# Patient Record
Sex: Female | Born: 1935
Health system: Southern US, Community
[De-identification: ages and names within clinical notes are randomized; demographics above are authoritative.]

---

## 2004-07-18 ENCOUNTER — Inpatient Hospital Stay: Payer: Self-pay

## 2004-09-11 ENCOUNTER — Ambulatory Visit: Payer: Self-pay | Admitting: Internal Medicine

## 2005-11-04 ENCOUNTER — Ambulatory Visit: Payer: Self-pay | Admitting: Specialist

## 2005-11-04 ENCOUNTER — Other Ambulatory Visit: Payer: Self-pay

## 2005-11-08 ENCOUNTER — Emergency Department: Payer: Self-pay | Admitting: Emergency Medicine

## 2005-11-08 ENCOUNTER — Other Ambulatory Visit: Payer: Self-pay

## 2005-11-09 ENCOUNTER — Other Ambulatory Visit: Payer: Self-pay

## 2005-11-09 ENCOUNTER — Inpatient Hospital Stay: Payer: Self-pay | Admitting: Specialist

## 2006-07-07 ENCOUNTER — Inpatient Hospital Stay: Payer: Self-pay | Admitting: Specialist

## 2006-07-07 ENCOUNTER — Other Ambulatory Visit: Payer: Self-pay

## 2006-07-25 ENCOUNTER — Ambulatory Visit: Payer: Self-pay | Admitting: Internal Medicine

## 2006-07-28 ENCOUNTER — Ambulatory Visit: Payer: Self-pay | Admitting: Internal Medicine

## 2006-08-01 ENCOUNTER — Ambulatory Visit: Payer: Self-pay | Admitting: Internal Medicine

## 2006-08-07 ENCOUNTER — Ambulatory Visit: Payer: Self-pay | Admitting: Internal Medicine

## 2006-08-26 ENCOUNTER — Ambulatory Visit: Payer: Self-pay | Admitting: Internal Medicine

## 2006-10-03 ENCOUNTER — Ambulatory Visit: Payer: Self-pay | Admitting: Internal Medicine

## 2006-10-06 ENCOUNTER — Ambulatory Visit: Payer: Self-pay | Admitting: Gastroenterology

## 2006-10-25 ENCOUNTER — Ambulatory Visit: Payer: Self-pay | Admitting: Internal Medicine

## 2007-07-16 ENCOUNTER — Emergency Department: Payer: Self-pay | Admitting: Emergency Medicine

## 2007-12-03 ENCOUNTER — Ambulatory Visit: Payer: Self-pay | Admitting: Specialist

## 2007-12-03 ENCOUNTER — Other Ambulatory Visit: Payer: Self-pay

## 2007-12-21 ENCOUNTER — Ambulatory Visit: Payer: Self-pay | Admitting: Internal Medicine

## 2008-04-25 IMAGING — CR DG CHEST 2V
1 series · 2 of 2 positions shown · non-contrast
Comparison: none

REASON FOR EXAM: Shortness of Breath
COMMENTS:

PROCEDURE:     DXR - DXR CHEST PA (OR AP) AND LATERAL  - July 07, 2006  [DATE]
RESULT:     PA and lateral view reveals cardiomegaly.  Cardiac pacemaker
lead wires are present. The lung fields are clear. No effusions are noted.
Degenerative spurs are noted in the thoracic spine.

[Series 1: view not recorded · 0.17mm/px · 2 of 2 slices shown]
[im 1/2]
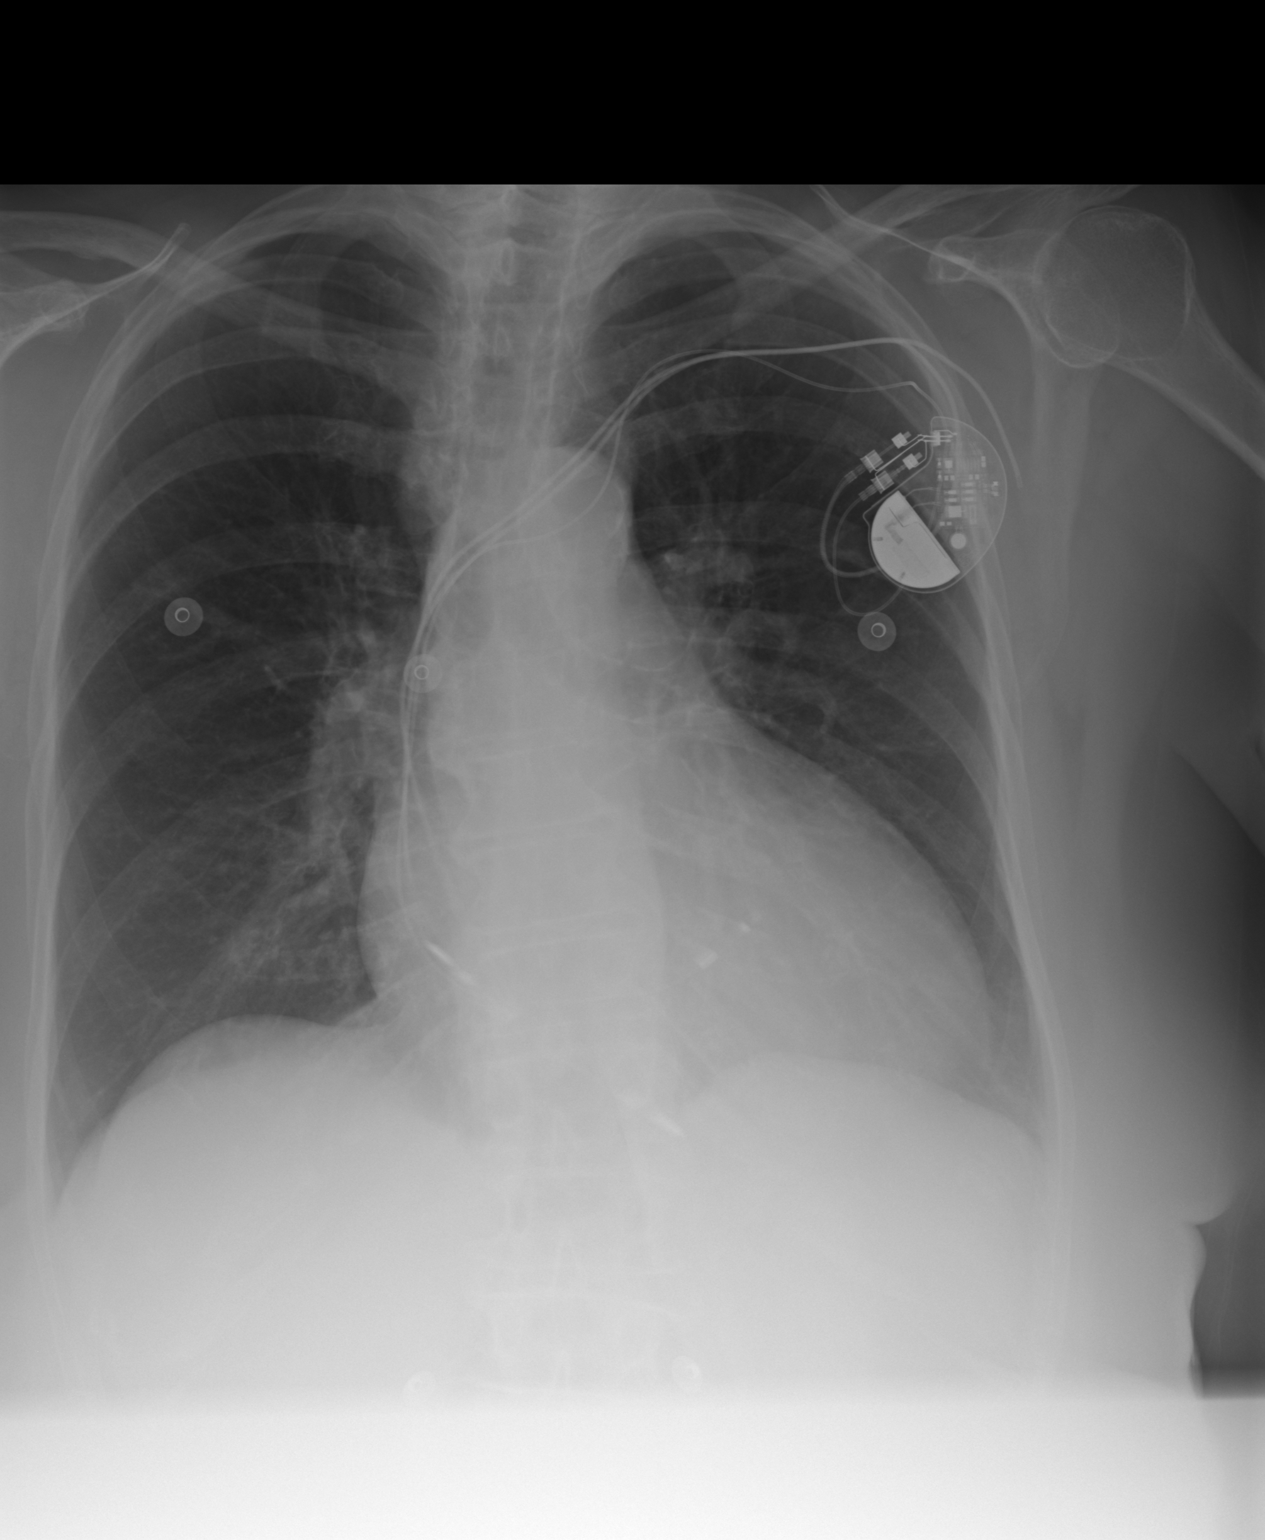
[im 2/2]
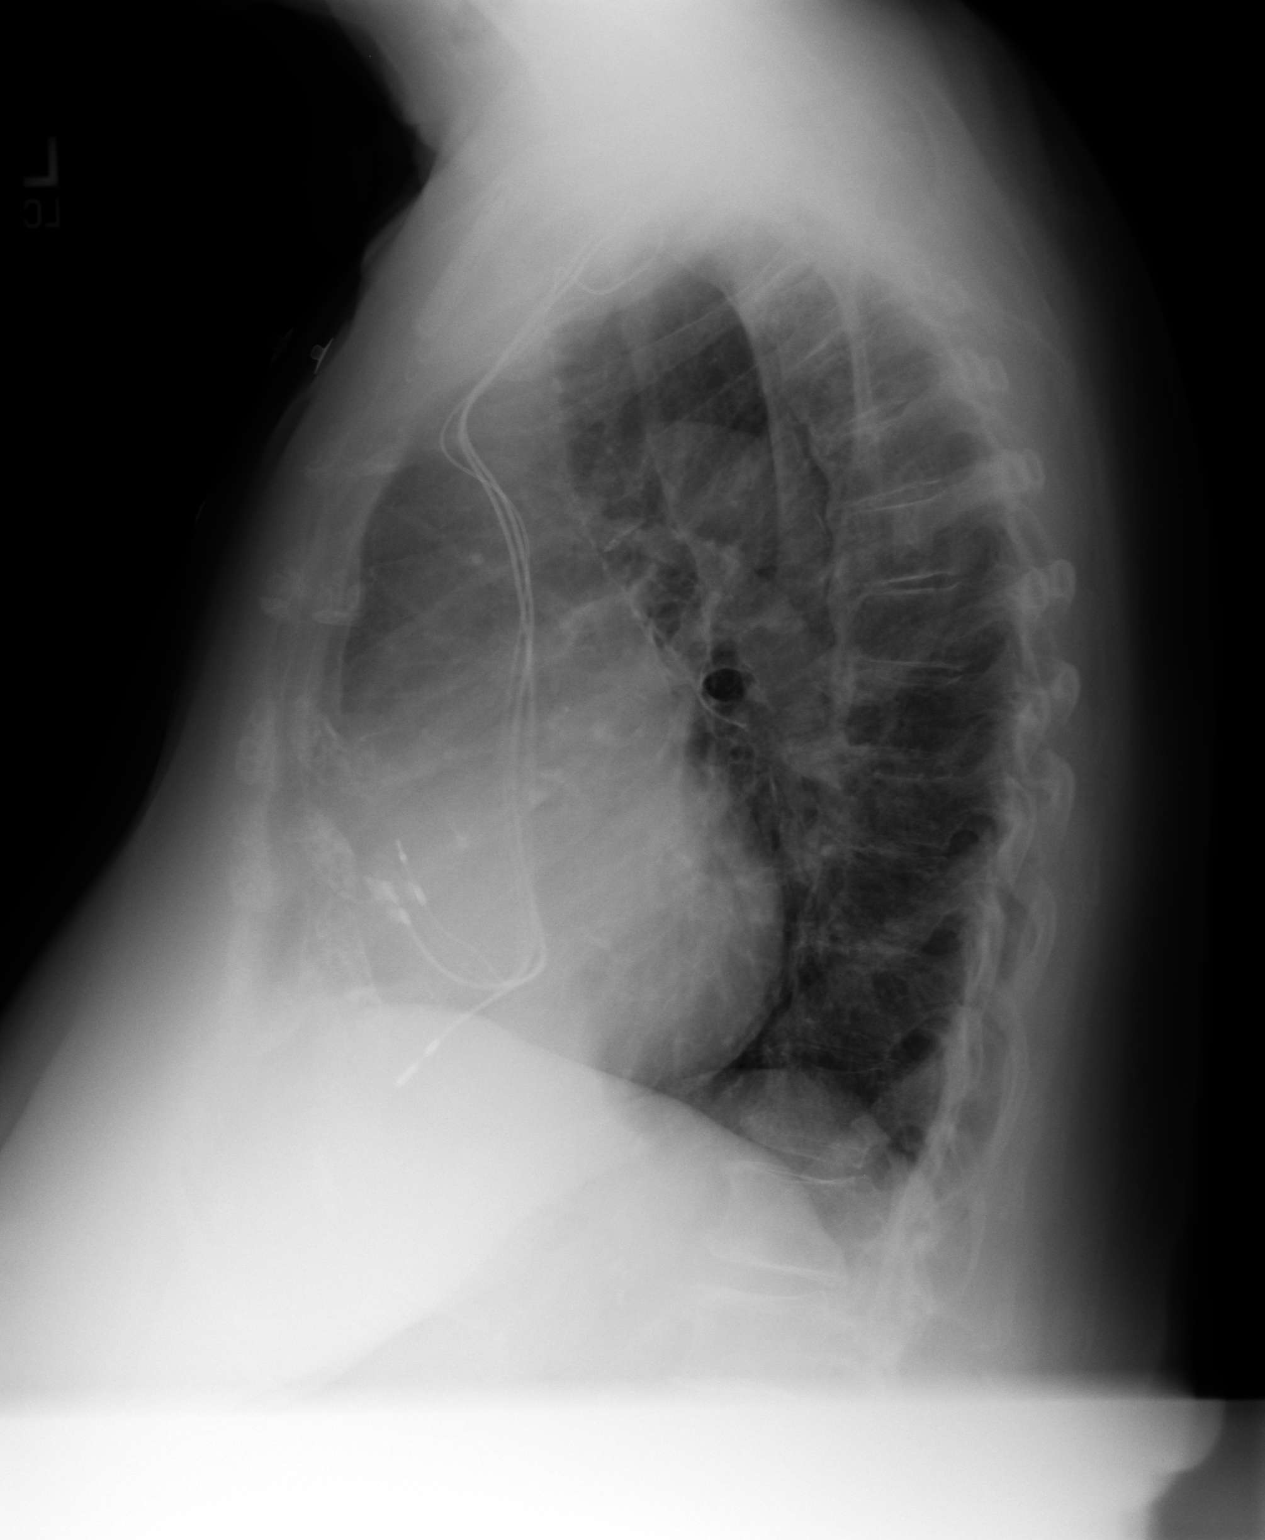

[2 of 2 positions shown; findings below may reference images not displayed]

IMPRESSION: 1)Cardiomegaly.

2)Lung fields are clear.

3)No congestive failure.

## 2009-07-25 ENCOUNTER — Ambulatory Visit: Payer: Self-pay | Admitting: Vascular Surgery

## 2009-08-30 ENCOUNTER — Ambulatory Visit: Payer: Self-pay | Admitting: Specialist

## 2009-09-12 ENCOUNTER — Ambulatory Visit: Payer: Self-pay | Admitting: Vascular Surgery

## 2009-12-19 ENCOUNTER — Inpatient Hospital Stay: Payer: Self-pay | Admitting: Internal Medicine

## 2010-10-22 ENCOUNTER — Ambulatory Visit: Payer: Self-pay | Admitting: Ophthalmology

## 2010-11-05 ENCOUNTER — Ambulatory Visit: Payer: Self-pay | Admitting: Ophthalmology

## 2010-12-19 ENCOUNTER — Ambulatory Visit: Payer: Self-pay | Admitting: Ophthalmology

## 2010-12-24 ENCOUNTER — Ambulatory Visit: Payer: Self-pay | Admitting: Ophthalmology

## 2011-01-03 ENCOUNTER — Ambulatory Visit: Payer: Self-pay | Admitting: Ophthalmology

## 2011-07-17 ENCOUNTER — Ambulatory Visit: Payer: Self-pay | Admitting: Specialist

## 2011-08-01 ENCOUNTER — Ambulatory Visit: Payer: Self-pay | Admitting: Cardiology

## 2011-08-06 ENCOUNTER — Ambulatory Visit: Payer: Self-pay | Admitting: Cardiology

## 2011-09-19 ENCOUNTER — Ambulatory Visit: Payer: Self-pay | Admitting: Gynecologic Oncology

## 2011-09-20 ENCOUNTER — Ambulatory Visit: Payer: Self-pay | Admitting: Urology

## 2011-09-24 ENCOUNTER — Ambulatory Visit: Payer: Self-pay | Admitting: Gynecologic Oncology

## 2011-09-27 ENCOUNTER — Ambulatory Visit: Payer: Self-pay | Admitting: Gynecologic Oncology

## 2011-10-25 ENCOUNTER — Ambulatory Visit: Payer: Self-pay | Admitting: Gynecologic Oncology

## 2011-11-25 ENCOUNTER — Ambulatory Visit: Payer: Self-pay | Admitting: Gynecologic Oncology

## 2011-12-25 ENCOUNTER — Ambulatory Visit: Payer: Self-pay | Admitting: Gynecologic Oncology

## 2012-02-04 ENCOUNTER — Ambulatory Visit: Payer: Self-pay | Admitting: Gynecologic Oncology

## 2012-02-24 ENCOUNTER — Ambulatory Visit: Payer: Self-pay | Admitting: Gynecologic Oncology

## 2012-03-13 ENCOUNTER — Encounter: Payer: Self-pay | Admitting: Cardiothoracic Surgery

## 2012-03-13 ENCOUNTER — Encounter: Payer: Self-pay | Admitting: Nurse Practitioner

## 2012-03-15 ENCOUNTER — Emergency Department: Payer: Self-pay | Admitting: Emergency Medicine

## 2012-03-15 LAB — URINALYSIS, COMPLETE
Bilirubin,UR: NEGATIVE
Blood: NEGATIVE
Glucose,UR: NEGATIVE mg/dL (ref 0–75)
Nitrite: NEGATIVE
Ph: 5 (ref 4.5–8.0)
Specific Gravity: 1.013 (ref 1.003–1.030)
Squamous Epithelial: 1

## 2012-03-15 LAB — COMPREHENSIVE METABOLIC PANEL
Albumin: 3.7 g/dL (ref 3.4–5.0)
Alkaline Phosphatase: 49 U/L — ABNORMAL LOW (ref 50–136)
BUN: 28 mg/dL — ABNORMAL HIGH (ref 7–18)
Bilirubin,Total: 0.4 mg/dL (ref 0.2–1.0)
Calcium, Total: 9 mg/dL (ref 8.5–10.1)
Chloride: 106 mmol/L (ref 98–107)
Creatinine: 0.89 mg/dL (ref 0.60–1.30)
EGFR (African American): >60
EGFR (Non-African Amer.): 58 — ABNORMAL LOW
Glucose: 122 mg/dL — ABNORMAL HIGH (ref 65–99)
Osmolality: 286 (ref 275–301)
Potassium: 4.3 mmol/L (ref 3.5–5.1)
Sodium: 140 mmol/L (ref 136–145)

## 2012-03-15 LAB — CBC
HGB: 11.9 g/dL — ABNORMAL LOW (ref 12.0–16.0)
MCH: 32 pg (ref 26.0–34.0)
Platelet: 243 10*3/uL (ref 150–440)
RBC: 3.73 10*6/uL — ABNORMAL LOW (ref 3.80–5.20)
WBC: 5.9 10*3/uL (ref 3.6–11.0)

## 2012-03-15 LAB — TROPONIN I: Troponin-I: <0.02 ng/mL

## 2012-03-15 LAB — PROTIME-INR: INR: 2.5

## 2012-03-26 ENCOUNTER — Encounter: Payer: Self-pay | Admitting: Nurse Practitioner

## 2012-03-26 ENCOUNTER — Encounter: Payer: Self-pay | Admitting: Cardiothoracic Surgery

## 2012-04-07 ENCOUNTER — Ambulatory Visit: Payer: Self-pay | Admitting: Vascular Surgery

## 2012-04-07 LAB — BASIC METABOLIC PANEL
Anion Gap: 9 (ref 7–16)
Calcium, Total: 9.2 mg/dL (ref 8.5–10.1)
Co2: 24 mmol/L (ref 21–32)
Potassium: 4.7 mmol/L (ref 3.5–5.1)
Sodium: 141 mmol/L (ref 136–145)

## 2012-04-07 LAB — PROTIME-INR
INR: 1.1
Prothrombin Time: 15 secs — ABNORMAL HIGH (ref 11.5–14.7)

## 2012-05-12 ENCOUNTER — Inpatient Hospital Stay: Payer: Self-pay | Admitting: Vascular Surgery

## 2012-05-12 LAB — HEMATOCRIT: HCT: 18.1 % — ABNORMAL LOW (ref 35.0–47.0)

## 2012-05-12 LAB — PROTIME-INR: INR: 1.5

## 2012-05-12 LAB — HEMOGLOBIN: HGB: 11.9 g/dL — ABNORMAL LOW (ref 12.0–16.0)

## 2012-05-12 LAB — BUN: BUN: 55 mg/dL — ABNORMAL HIGH (ref 7–18)

## 2012-05-19 ENCOUNTER — Inpatient Hospital Stay: Payer: Self-pay | Admitting: Internal Medicine

## 2012-05-19 LAB — CBC
HCT: 23.6 % — ABNORMAL LOW (ref 35.0–47.0)
MCHC: 34 g/dL (ref 32.0–36.0)
MCV: 97 fL (ref 80–100)
WBC: 13.1 10*3/uL — ABNORMAL HIGH (ref 3.6–11.0)

## 2012-05-19 LAB — COMPREHENSIVE METABOLIC PANEL
Albumin: 2.5 g/dL — ABNORMAL LOW (ref 3.4–5.0)
Alkaline Phosphatase: 66 U/L (ref 50–136)
Anion Gap: 8 (ref 7–16)
Bilirubin,Total: 0.5 mg/dL (ref 0.2–1.0)
Calcium, Total: 8.7 mg/dL (ref 8.5–10.1)
Co2: 25 mmol/L (ref 21–32)
EGFR (Non-African Amer.): >60
Osmolality: 285 (ref 275–301)
Potassium: 4.5 mmol/L (ref 3.5–5.1)
SGOT(AST): 29 U/L (ref 15–37)
Sodium: 137 mmol/L (ref 136–145)

## 2012-05-19 LAB — URINALYSIS, COMPLETE
Bilirubin,UR: NEGATIVE
Ketone: NEGATIVE
Nitrite: NEGATIVE
Ph: 8 (ref 4.5–8.0)
Protein: 30
RBC,UR: NONE SEEN /HPF (ref 0–5)
WBC UR: 3 /HPF (ref 0–5)

## 2012-05-19 LAB — PROTIME-INR: INR: 1.3

## 2012-05-19 LAB — APTT: Activated PTT: 34.3 secs (ref 23.6–35.9)

## 2012-05-19 LAB — LIPASE, BLOOD: Lipase: 442 U/L — ABNORMAL HIGH (ref 73–393)

## 2012-05-20 LAB — HEMOGLOBIN
HGB: 7.6 g/dL — ABNORMAL LOW (ref 12.0–16.0)
HGB: 8.1 g/dL — ABNORMAL LOW (ref 12.0–16.0)

## 2012-05-20 LAB — OCCULT BLOOD X 1 CARD TO LAB, STOOL: Occult Blood, Feces: POSITIVE

## 2012-05-23 LAB — CBC WITH DIFFERENTIAL/PLATELET
Basophil %: 0.2 %
Eosinophil %: 2.9 %
HCT: 21.7 % — ABNORMAL LOW (ref 35.0–47.0)
HGB: 7.2 g/dL — ABNORMAL LOW (ref 12.0–16.0)
Lymphocyte #: 0.6 10*3/uL — ABNORMAL LOW (ref 1.0–3.6)
MCH: 31.7 pg (ref 26.0–34.0)
Monocyte #: 0.7 x10 3/mm (ref 0.2–0.9)
Monocyte %: 8.3 %
Neutrophil #: 6.8 10*3/uL — ABNORMAL HIGH (ref 1.4–6.5)
Neutrophil %: 81.4 %
Platelet: 248 10*3/uL (ref 150–440)
RBC: 2.26 10*6/uL — ABNORMAL LOW (ref 3.80–5.20)
RDW: 14.7 % — ABNORMAL HIGH (ref 11.5–14.5)
WBC: 8.3 10*3/uL (ref 3.6–11.0)

## 2012-05-23 LAB — URINE CULTURE

## 2012-05-23 LAB — HEMOGLOBIN: HGB: 7.2 g/dL — ABNORMAL LOW (ref 12.0–16.0)

## 2012-05-24 LAB — CBC WITH DIFFERENTIAL/PLATELET
Basophil #: 0.1 10*3/uL (ref 0.0–0.1)
Basophil %: 0.7 %
Eosinophil #: 0.3 10*3/uL (ref 0.0–0.7)
HGB: 7.2 g/dL — ABNORMAL LOW (ref 12.0–16.0)
Lymphocyte #: 0.8 10*3/uL — ABNORMAL LOW (ref 1.0–3.6)
Lymphocyte %: 9.5 %
MCH: 31.8 pg (ref 26.0–34.0)
MCHC: 33.1 g/dL (ref 32.0–36.0)
MCV: 96 fL (ref 80–100)
Monocyte #: 0.7 x10 3/mm (ref 0.2–0.9)
Neutrophil #: 6.3 10*3/uL (ref 1.4–6.5)
Neutrophil %: 77.5 %
RDW: 14.6 % — ABNORMAL HIGH (ref 11.5–14.5)

## 2012-05-26 LAB — CBC WITH DIFFERENTIAL/PLATELET
Basophil #: 0 10*3/uL (ref 0.0–0.1)
Eosinophil #: 0.1 10*3/uL (ref 0.0–0.7)
HCT: 25.8 % — ABNORMAL LOW (ref 35.0–47.0)
HGB: 8.8 g/dL — ABNORMAL LOW (ref 12.0–16.0)
Lymphocyte %: 4.8 %
MCH: 31.4 pg (ref 26.0–34.0)
MCHC: 33.9 g/dL (ref 32.0–36.0)
MCV: 93 fL (ref 80–100)
Monocyte #: 0.9 x10 3/mm (ref 0.2–0.9)
Monocyte %: 8.8 %
Neutrophil #: 9 10*3/uL — ABNORMAL HIGH (ref 1.4–6.5)
Neutrophil %: 85.5 %
RDW: 15.7 % — ABNORMAL HIGH (ref 11.5–14.5)

## 2012-05-26 LAB — TROPONIN I: Troponin-I: 0.03 ng/mL

## 2012-05-26 LAB — PROTIME-INR: Prothrombin Time: 21.2 secs — ABNORMAL HIGH (ref 11.5–14.7)

## 2012-05-27 DIAGNOSIS — I369 Nonrheumatic tricuspid valve disorder, unspecified: Secondary | ICD-10-CM

## 2012-05-27 LAB — CBC WITH DIFFERENTIAL/PLATELET
Eosinophil #: 0 10*3/uL (ref 0.0–0.7)
Eosinophil %: 0.1 %
Lymphocyte #: 0.8 10*3/uL — ABNORMAL LOW (ref 1.0–3.6)
Lymphocyte %: 6.2 %
MCH: 32 pg (ref 26.0–34.0)
MCHC: 34.3 g/dL (ref 32.0–36.0)
MCV: 93 fL (ref 80–100)
Monocyte #: 1.2 x10 3/mm — ABNORMAL HIGH (ref 0.2–0.9)
Neutrophil #: 10.2 10*3/uL — ABNORMAL HIGH (ref 1.4–6.5)
Neutrophil %: 83.6 %
Platelet: 218 10*3/uL (ref 150–440)
RBC: 2.57 10*6/uL — ABNORMAL LOW (ref 3.80–5.20)

## 2012-05-27 LAB — URINALYSIS, COMPLETE
Bacteria: NONE SEEN
Bilirubin,UR: NEGATIVE
Blood: NEGATIVE
Glucose,UR: NEGATIVE mg/dL (ref 0–75)
Hyaline Cast: 7
Ketone: NEGATIVE
Nitrite: NEGATIVE
Specific Gravity: 1.01 (ref 1.003–1.030)
Squamous Epithelial: <1
WBC UR: 1 /HPF (ref 0–5)

## 2012-05-27 LAB — BASIC METABOLIC PANEL
Anion Gap: 8 (ref 7–16)
BUN: 15 mg/dL (ref 7–18)
Calcium, Total: 8.2 mg/dL — ABNORMAL LOW (ref 8.5–10.1)
Chloride: 105 mmol/L (ref 98–107)
Co2: 25 mmol/L (ref 21–32)
Creatinine: 0.85 mg/dL (ref 0.60–1.30)
Osmolality: 281 (ref 275–301)
Potassium: 3.9 mmol/L (ref 3.5–5.1)
Sodium: 138 mmol/L (ref 136–145)

## 2012-05-27 LAB — CREATININE, SERUM
Creatinine: 0.85 mg/dL (ref 0.60–1.30)
EGFR (African American): >60
EGFR (Non-African Amer.): >60

## 2012-05-28 ENCOUNTER — Ambulatory Visit: Payer: Self-pay | Admitting: Internal Medicine

## 2012-05-28 LAB — URINE CULTURE

## 2012-05-30 LAB — CBC WITH DIFFERENTIAL/PLATELET
Basophil #: 0 10*3/uL (ref 0.0–0.1)
Basophil %: 0.3 %
Eosinophil %: 1.9 %
HCT: 22.4 % — ABNORMAL LOW (ref 35.0–47.0)
HGB: 7.4 g/dL — ABNORMAL LOW (ref 12.0–16.0)
Lymphocyte #: 0.6 10*3/uL — ABNORMAL LOW (ref 1.0–3.6)
MCHC: 32.9 g/dL (ref 32.0–36.0)
MCV: 94 fL (ref 80–100)
Monocyte %: 7.2 %
Neutrophil #: 8.6 10*3/uL — ABNORMAL HIGH (ref 1.4–6.5)
RDW: 15.4 % — ABNORMAL HIGH (ref 11.5–14.5)
WBC: 10.2 10*3/uL (ref 3.6–11.0)

## 2012-05-31 LAB — CBC WITH DIFFERENTIAL/PLATELET
Basophil #: 0 10*3/uL (ref 0.0–0.1)
Eosinophil #: 0.3 10*3/uL (ref 0.0–0.7)
Eosinophil %: 2.8 %
Lymphocyte #: 0.9 10*3/uL — ABNORMAL LOW (ref 1.0–3.6)
Lymphocyte %: 8.8 %
MCH: 31.2 pg (ref 26.0–34.0)
Monocyte %: 8.6 %
Neutrophil %: 79.5 %
Platelet: 226 10*3/uL (ref 150–440)
RBC: 2.45 10*6/uL — ABNORMAL LOW (ref 3.80–5.20)
RDW: 15.2 % — ABNORMAL HIGH (ref 11.5–14.5)
WBC: 10 10*3/uL (ref 3.6–11.0)

## 2012-05-31 LAB — BASIC METABOLIC PANEL
Calcium, Total: 8.3 mg/dL — ABNORMAL LOW (ref 8.5–10.1)
Chloride: 102 mmol/L (ref 98–107)
Creatinine: 1.03 mg/dL (ref 0.60–1.30)
EGFR (African American): 57 — ABNORMAL LOW
EGFR (Non-African Amer.): 49 — ABNORMAL LOW
Potassium: 4.4 mmol/L (ref 3.5–5.1)
Sodium: 141 mmol/L (ref 136–145)

## 2012-06-01 LAB — HEMOGLOBIN: HGB: 9.4 g/dL — ABNORMAL LOW (ref 12.0–16.0)

## 2012-06-01 LAB — CULTURE, BLOOD (SINGLE)

## 2012-06-03 LAB — BASIC METABOLIC PANEL
BUN: 30 mg/dL — ABNORMAL HIGH (ref 7–18)
Chloride: 95 mmol/L — ABNORMAL LOW (ref 98–107)
Co2: 37 mmol/L — ABNORMAL HIGH (ref 21–32)
Creatinine: 1.12 mg/dL (ref 0.60–1.30)
Potassium: 3.5 mmol/L (ref 3.5–5.1)
Sodium: 140 mmol/L (ref 136–145)

## 2012-06-26 ENCOUNTER — Ambulatory Visit: Payer: Self-pay | Admitting: Internal Medicine

## 2012-06-26 DEATH — deceased

## 2014-01-02 IMAGING — CT CT ABD-PELV W/ CM
1 of 2 series · 15 of 32 positions shown, 19 images · non-contrast
Comparison: none

REASON FOR EXAM: (1) vomiting abd pain; (2) vomiting abd pain
COMMENTS:

[Series 2: 3mm soft tissue · axial · 0.95mm/px · z∈[-1430,-1000]mm · 15 of 157 slices shown, 19 images]
[im 7/157  soft-tissue]
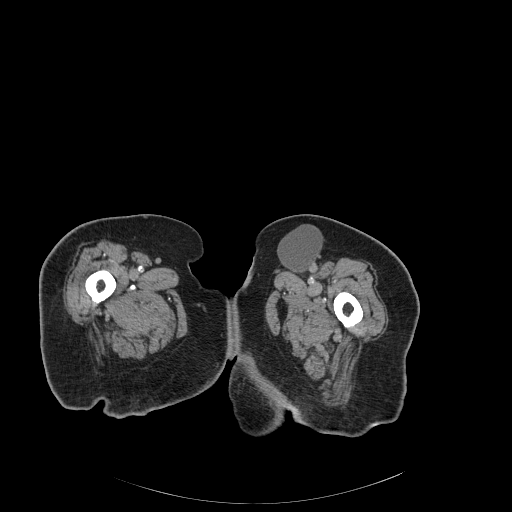
[im 7/157  bone]
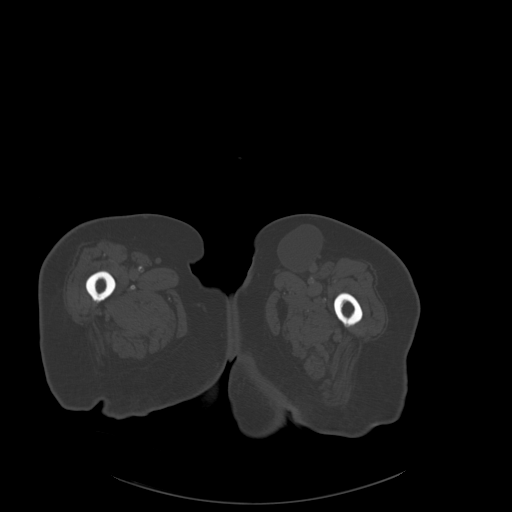
[im 20/157  soft-tissue]
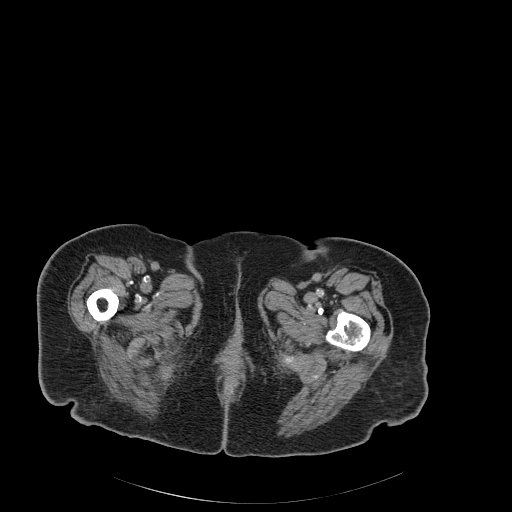
[im 33/157  soft-tissue]
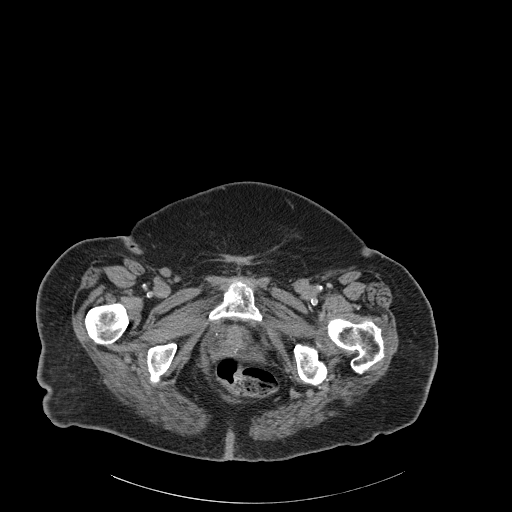
[im 46/157  soft-tissue]
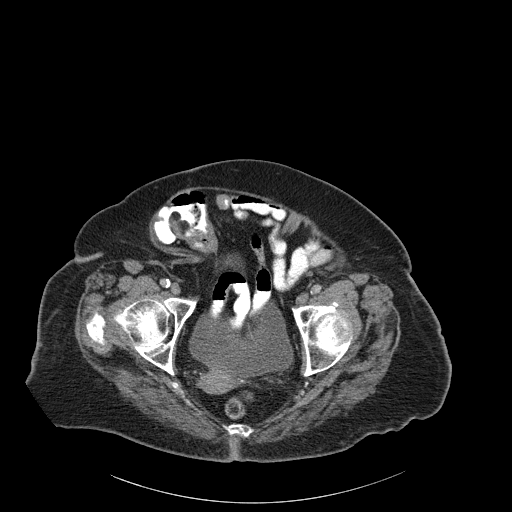
[im 53/157  soft-tissue]
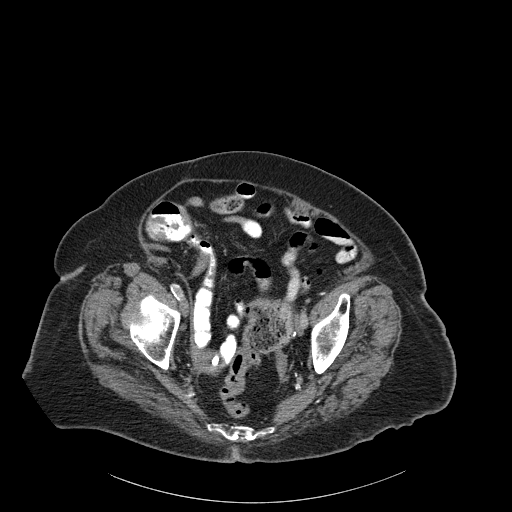
[im 66/157  soft-tissue]
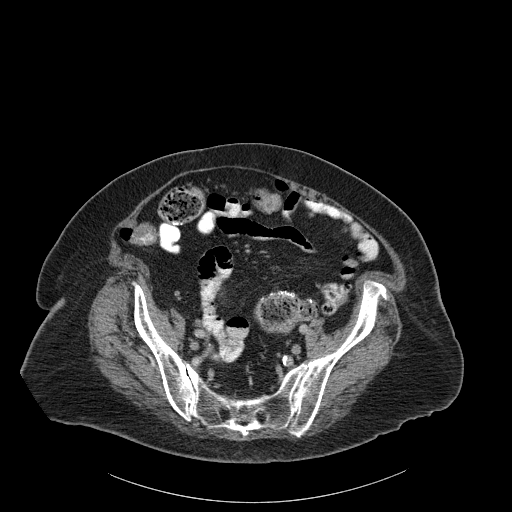
[im 79/157  soft-tissue]
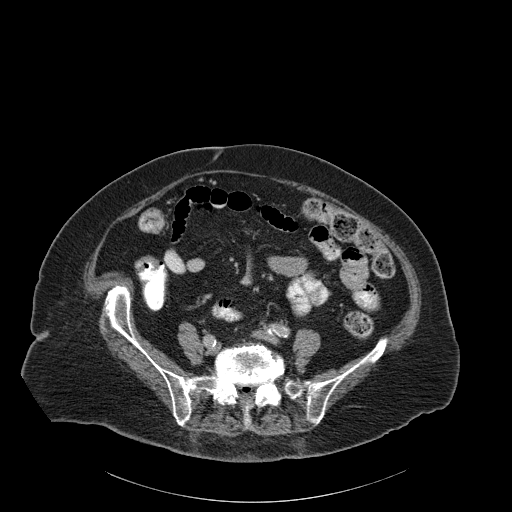
[im 92/157  soft-tissue]
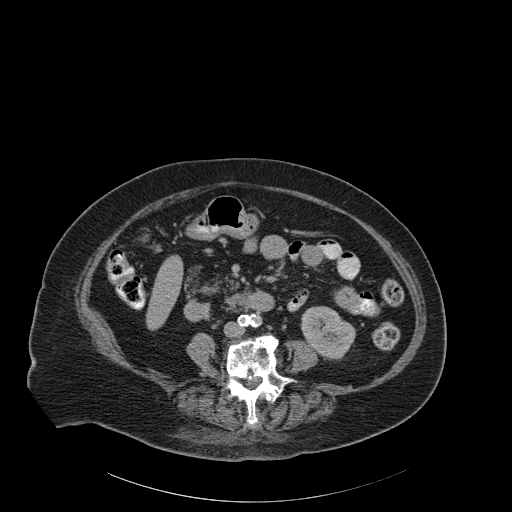
[im 105/157  soft-tissue]
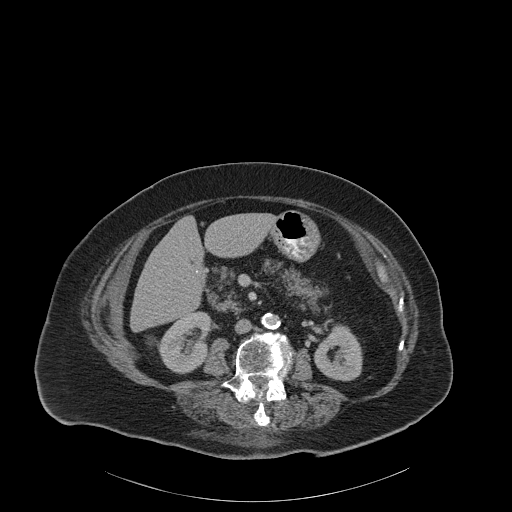
[im 105/157  bone]
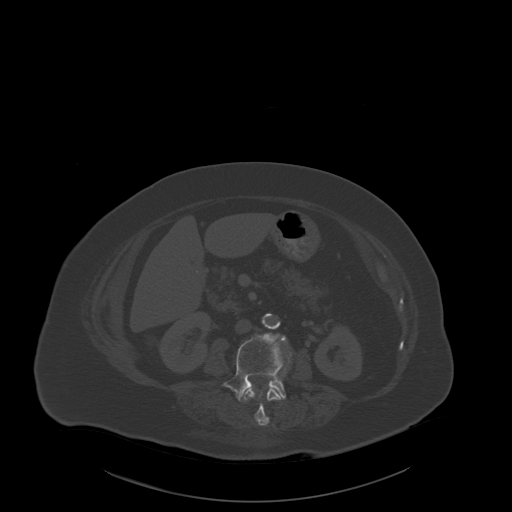
[im 111/157  soft-tissue]
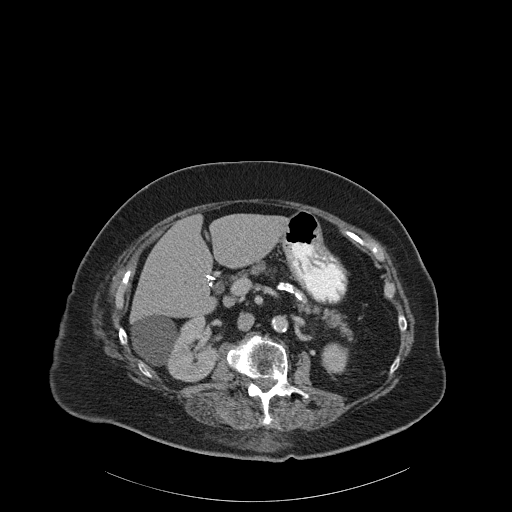
[im 124/157  soft-tissue]
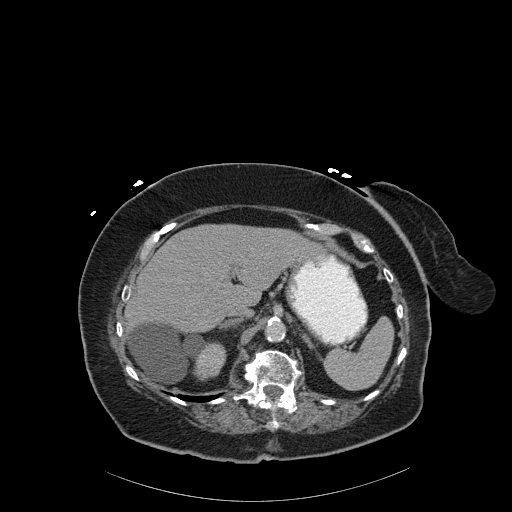
[im 131/157  lung]
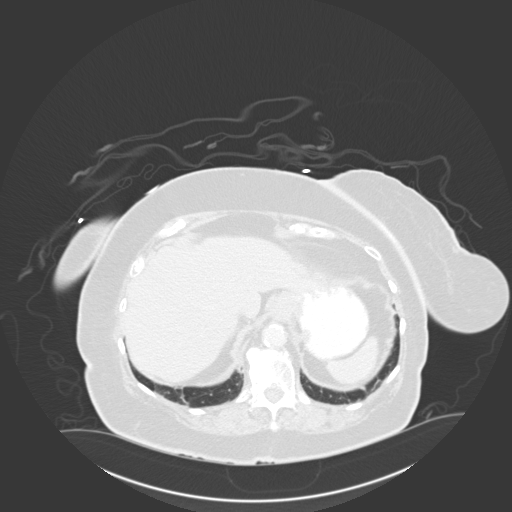
[im 137/157  soft-tissue]
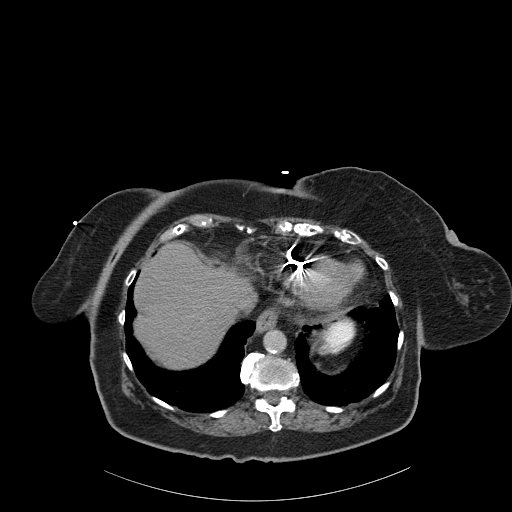
[im 137/157  lung]
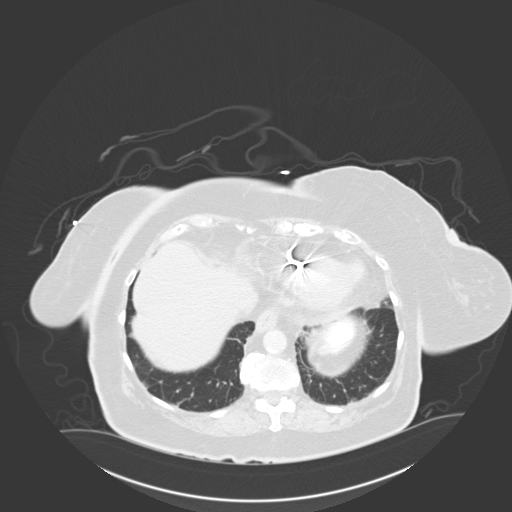
[im 144/157  lung]
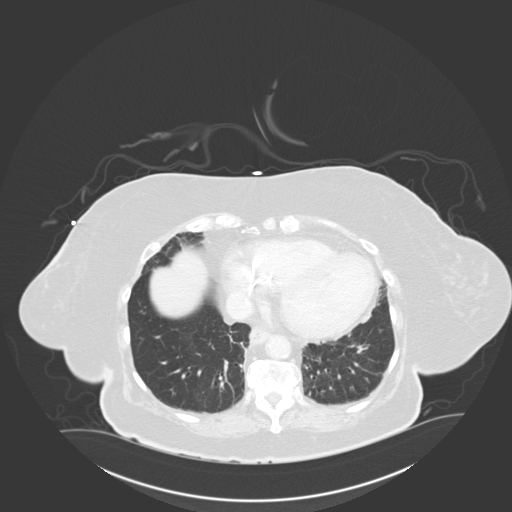
[im 150/157  soft-tissue]
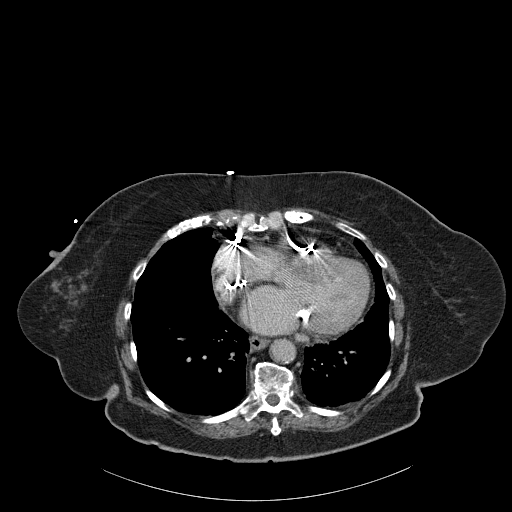
[im 150/157  lung]
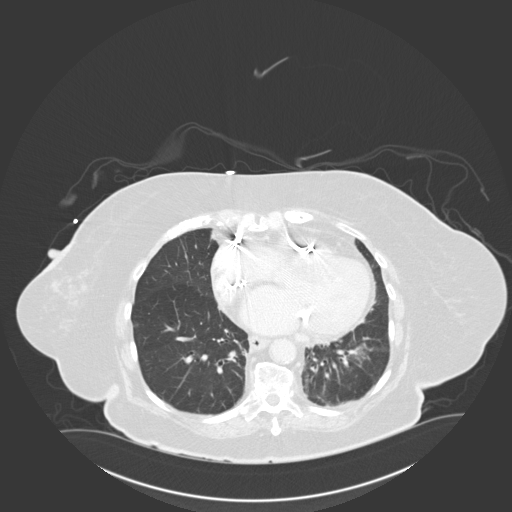

[15 of 32 positions shown; findings below may reference images not displayed]

PROCEDURE:     CT  - CT ABDOMEN / PELVIS  W  - March 15, 2012  [DATE]

RESULT:     CT of the abdomen and pelvis utilizing 85 mL of Usovue-E44 and
oral contrast is reconstructed at 3.0 mm slice thickness in the axial plane
and compared images dated 21 September, 2011.

There is a large cystic area in the left inguinal region which could
represent hematoma or other cystic mass. This measures as much as 4.75 cm
anterior to posterior x 4.12 cm medial to lateral and was not present
previously. Images through the bases of the lungs demonstrate some fibrosis
and presumed atelectasis. The liver shows no mass or enlargement. The spleen
is unremarkable. Pancreas appears within normal limits with some fatty
infiltration present. Cysts appear present in the upper to mid right kidney
and are unchanged. The largest reaches a diameter medial to lateral
approximately 6.10 cm with an anterior to posterior dimension of 6.28 cm on
image 39. Atherosclerotic calcification is noted within the aorta. There is
no aneurysm. Moderate amount of fecal material is present within the sigmoid
colon as demonstrated previously. Degenerative changes are seen in the pubic
symphysis and hips. Lumbar degenerative changes are present. No adenopathy
is evident. The adrenal glands are normal. The left kidney appears
unremarkable. There is no acute inflammatory process. The urinary bladder
appears unremarkable. An atrophic uterus with some calcification is present
in the right pelvis. Previously demonstrated a vulvar mass is not evident.
IMPRESSION: 1. Large cystic mass in the left inguinal region.
2. Stable renal cysts.
3. Atherosclerotic disease.
4. Lung base fibrosis and atelectasis.
5. No abnormal our distention.

[REDACTED]

## 2014-01-02 IMAGING — CR DG ABDOMEN 3V
1 series · 4 of 4 positions shown · non-contrast
Comparison: none

REASON FOR EXAM: ABD PAIN, VOMITING
COMMENTS:   May transport without cardiac monitor

[Series 1: ap · 0.17mm/px · 4 of 4 slices shown]
[im 1/4]
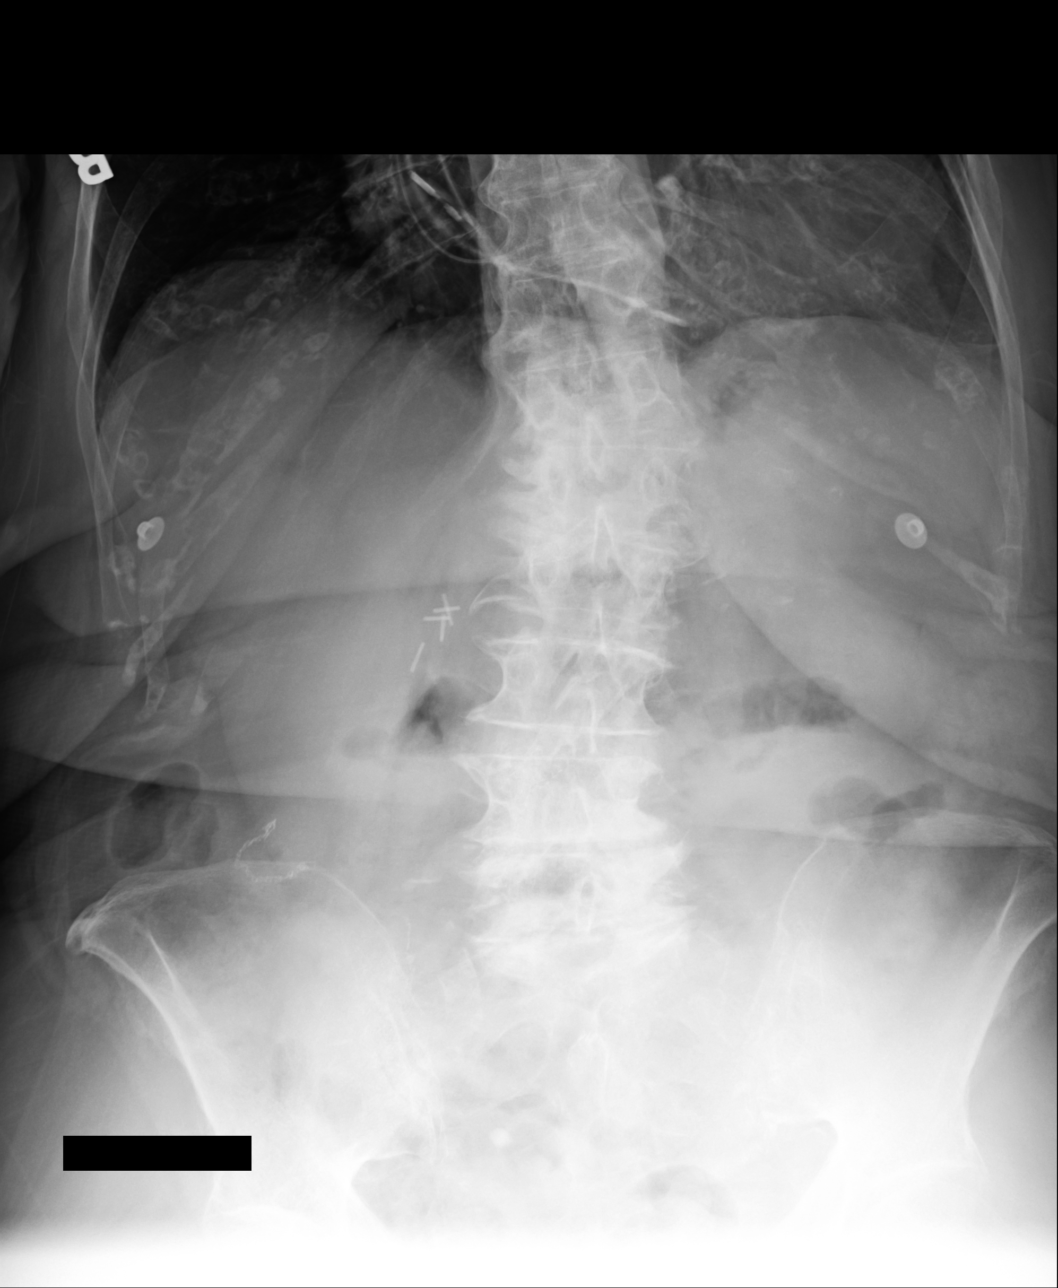
[im 2/4]
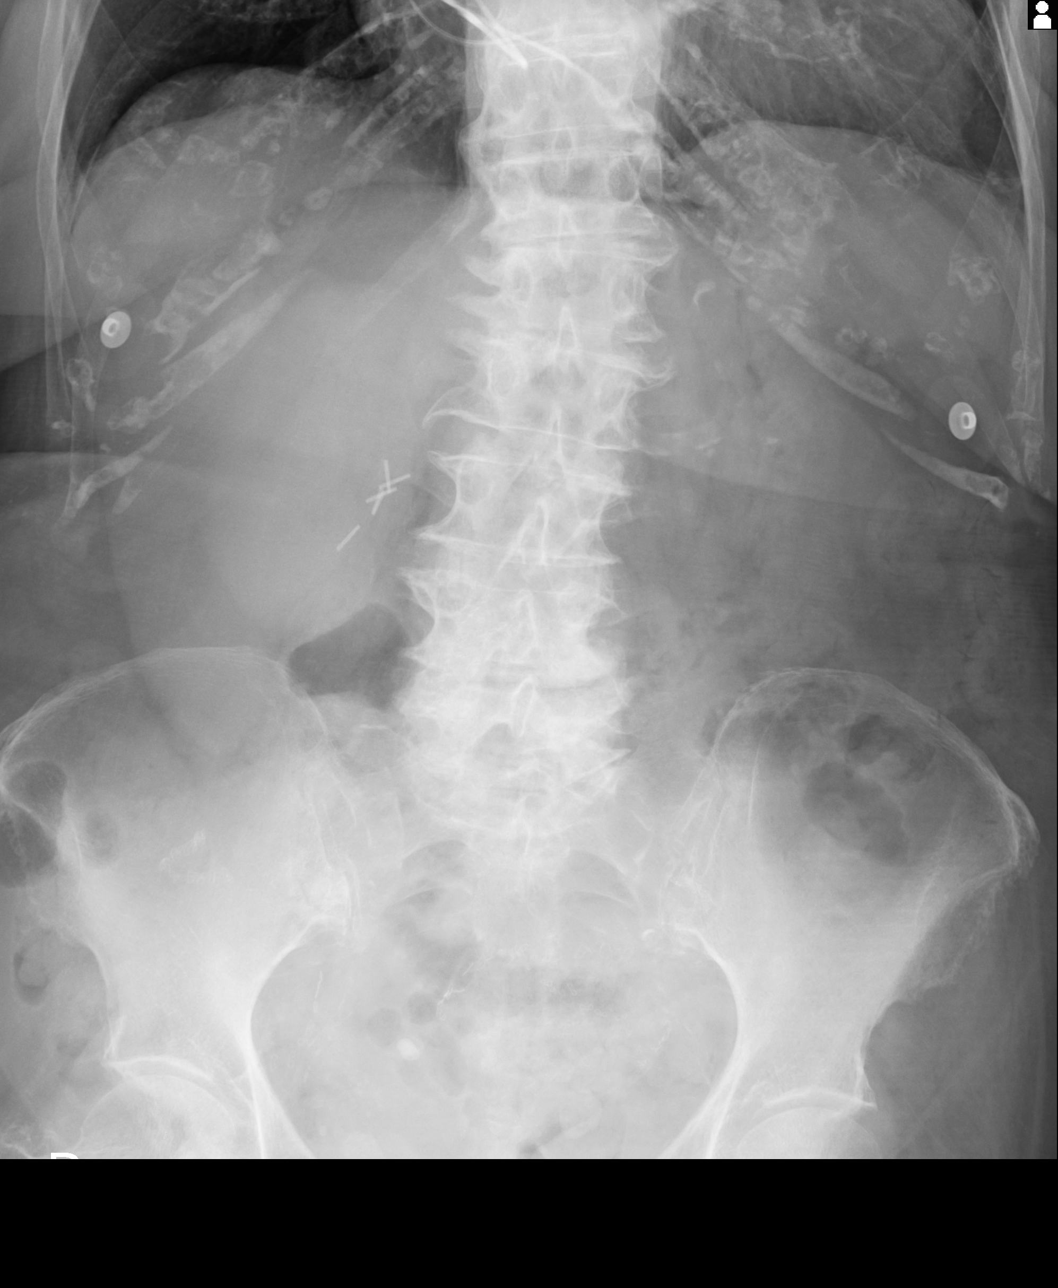
[im 3/4]
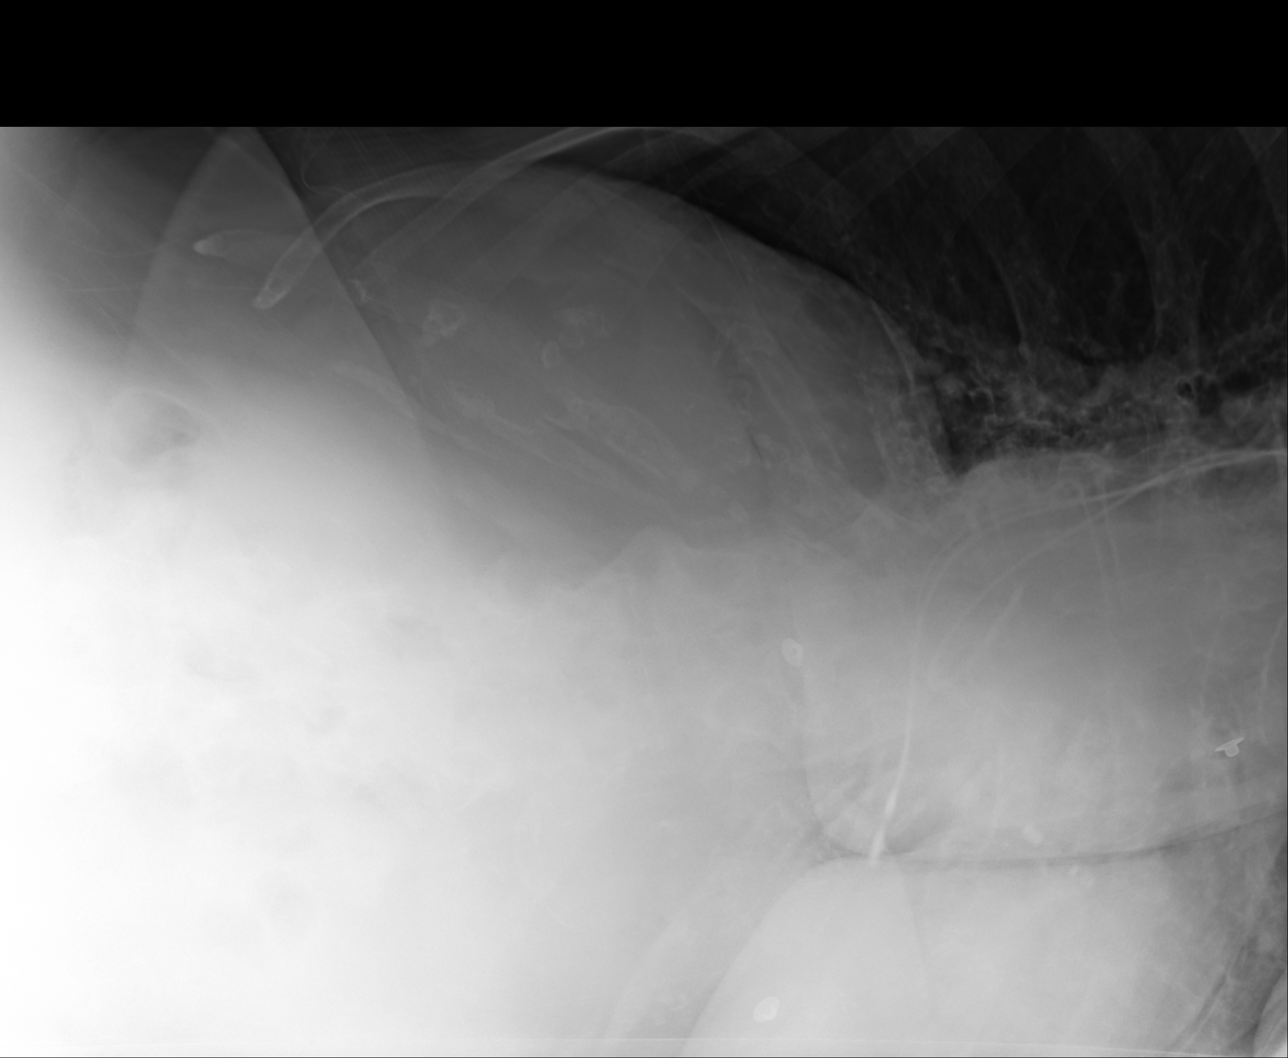
[im 4/4]
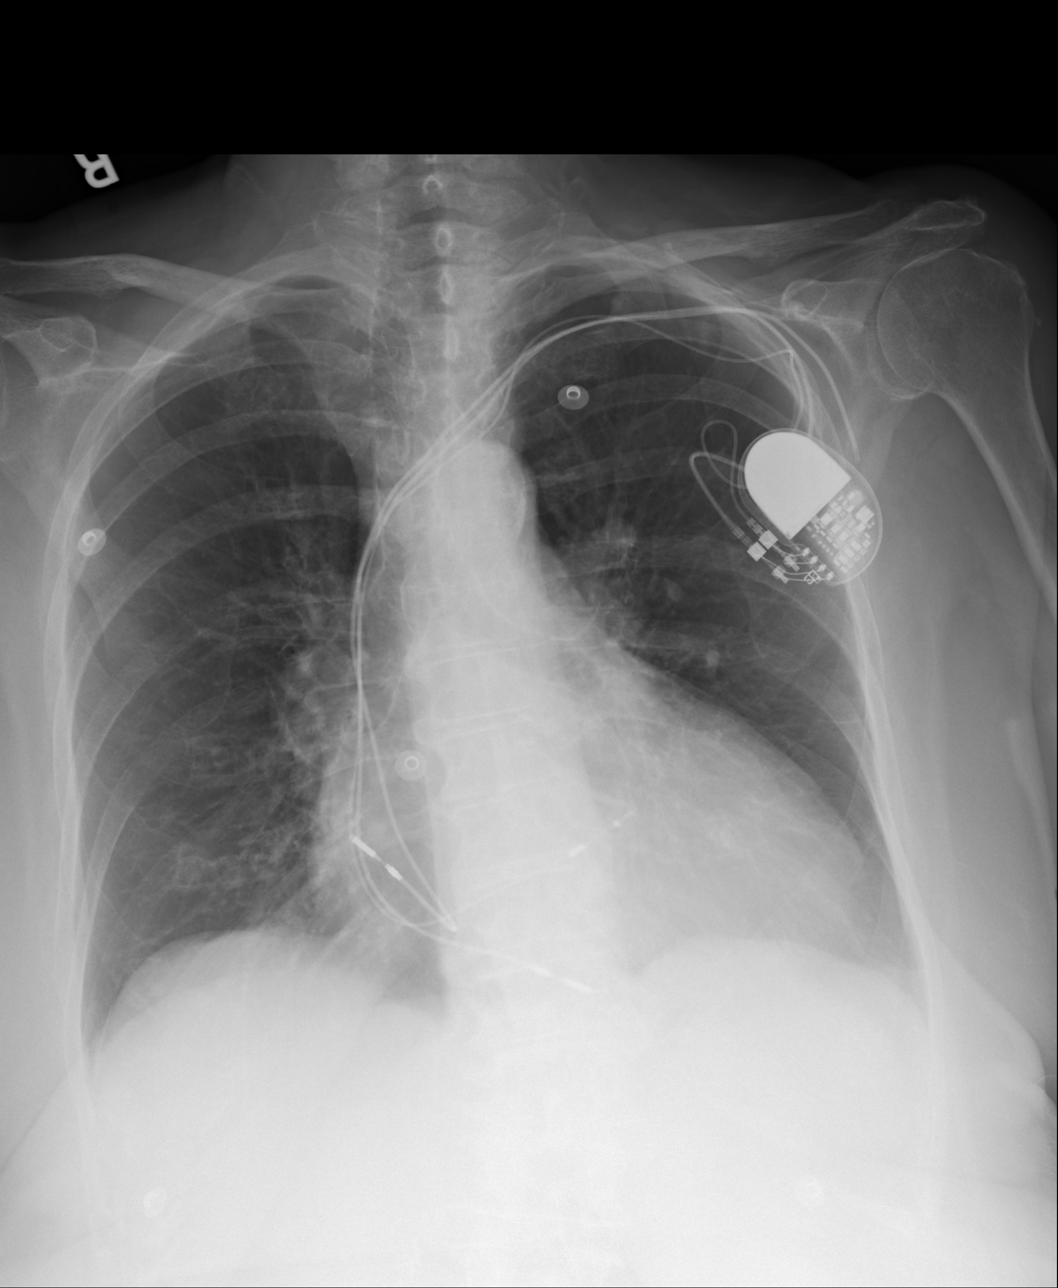

[4 of 4 positions shown; findings below may reference images not displayed]

PROCEDURE:     DXR - DXR ABDOMEN 3-WAY (INCL PA CXR)  - March 15, 2012  [DATE]

RESULT:     Comparison is made to the study 20 December, 2009.

Severe degenerative changes are noted within the spine. The bowel gas
pattern shows no free air. The heart is enlarged. Pacemakers present of the
left chest. There is no infiltrate, effusion or pneumothorax. Prominent
costochondral calcification is present.
IMPRESSION: 1. No evidence of bowel obstruction or perforation.
2. Stable cardiomegaly.
3. Severe degenerative changes are noted in the spine.

[REDACTED]

## 2014-12-13 NOTE — Op Note (Signed)
PATIENT NAME:  ARIZA, EVANS MR#:  [id number] DATE OF BIRTH:  06-21-1923  DATE OF PROCEDURE:  04/07/2012  PREOPERATIVE DIAGNOSIS: Atherosclerotic occlusive disease, right lower extremity with ulceration.   POSTOPERATIVE DIAGNOSIS: Atherosclerotic occlusive disease, right lower extremity with ulceration   PROCEDURES PERFORMED:  1. Abdominal aortogram.  2. Right lower extremity distal runoff, third order catheter placement.  3. Crosser atherectomy, right superficial femoral artery occlusion, unsuccessful.   SURGEON: Renford Dills, MD  SEDATION: Versed 4 mg plus fentanyl 100 mcg administered IV. Continuous ECG, pulse oximetry and cardiopulmonary monitoring is performed throughout the entire procedure by the interventional radiology nurse. Total sedation time was 1.5 hours.   ACCESS: 6 French sheath left common femoral artery.   FLUORO TIME: 22.3 minutes.   CONTRAST USED: Isovue 64 mL.   INDICATIONS: Ms. Clavel is an 81 year old woman who presented to the office with ulceration of the right foot. Risks and benefits for angiography and revascularization for limb salvage were reviewed. All questions answered. Patient agrees to proceed.   DESCRIPTION OF PROCEDURE: Patient is taken to special procedures, placed in supine position. After adequate sedation is achieved, left groin is prepped and draped in sterile fashion. Ultrasound is placed in a sterile sleeve. Femoral artery is identified. It is pulsatile indicating patency. Image is recorded. Micropuncture needle is used to access it with ultrasound guidance. Microwire followed by micro sheath, J-wire followed by a 5 French sheath, 5 French pigtail catheter. Pigtail catheter is positioned at T12. AP projection of the aorta is obtained. Pigtail catheter is repositioned and LAO projection of the pelvis is obtained. Using a combination of Glidewire, Rim, pigtail and Sauce catheter ultimately a VS-1 catheter crossed the bifurcation and  the catheter and wire were advanced down into the mid profunda. Wire is exchanged for an Amplatz extra stiff wire and an Ansel 6 French sheath is delivered up and over the bifurcation with the tip at the proximal common femoral artery.   4000 units of heparin is given. [place] Slam wire and angled Usher catheter are then used to engage the very small cul-de-sac of the SFA. S6 device is then advanced. The S6 device advances down into the mid SFA, but appears then to course outside the artery. By hand injection of contrast this appears to be confirmed and over the next several attempts different techniques were used; Kumpe catheter, angled Glidewire trying to renegotiate the S6. These were all to no avail and re-entry at the distal reconstitution point was not achieved. Sheath was then pulled into the left groin. Oblique view taken and a StarClose device deployed successfully. There are no immediate complications.   INTERPRETATION: The abdominal aorta is tortuous and demonstrates diffuse disease, however, there are no hemodynamically significant stenoses. There is a stent in the proximal right common iliac which protrudes several millimeters into the aorta. Common iliac is tortuous with a markedly sharp angulation, however there are no hemodynamically significant stenoses noted within the common or external iliac.   The common femoral and profunda femoris are widely patent. Superficial femoral artery occludes within several millimeters of its origin. It remains occluded down to Hunter's canal where a large collateral reconstitutes. There is diffuse disease throughout the course of the above-knee and mid popliteal. Knee replacement obscures several areas, however, the origin of the anterior tibial as well as the origin of the tibioperoneal trunk demonstrate high-grade stenoses of approximately 80% to 90%. There appears to be occlusion of the anterior tibial as well as the posterior  tibial. Peroneal appears to be  the dominant runoff down to the distal calf area.   SUMMARY: Unsuccessful revascularization of the SFA. Peroneal appears to be the dominant runoff.   ____________________________ Renford DillsGregory G. Jarris Kortz, MD ggs:cms D: 04/07/2012 10:19:30 ET T: 04/07/2012 11:16:27 ET JOB#: [id number]  cc: Renford DillsGregory G. Sharilynn Cassity, MD, <Dictator> Renford DillsGREGORY G Boleslaus Holloway MD ELECTRONICALLY SIGNED 04/21/2012 10:48

## 2014-12-13 NOTE — Consult Note (Signed)
Chief Complaint:   Subjective/Chief Complaint denies abdominal pain or nausea.  Sleepy-had IVC filter placed this am. one brown bm.  no evidence of active GI bleeding   VITAL SIGNS/ANCILLARY NOTES: **Vital Signs.:   26-Sep-13 10:25   Vital Signs Type Q 4hr   Temperature Temperature (F) 97.7   Celsius 36.5   Temperature Source oral   Pulse Pulse 60   Respirations Respirations 24   Systolic BP Systolic BP 175   Diastolic BP (mmHg) Diastolic BP (mmHg) 60   Mean BP 98   Pulse Ox % Pulse Ox % 95   Pulse Ox Activity Level  At rest   Oxygen Delivery 2L  *Intake and Output.:   26-Sep-13 05:30   Stool  unmeasureable poop skid marks...   Brief Assessment:   Cardiac Irregular    Respiratory clear BS    Gastrointestinal details normal Soft  Nondistended  No masses palpable  Bowel sounds normal  No rebound tenderness  mild rlq discomfort in the area of the hematoma   Lab Results: Routine Hem:  26-Sep-13 06:49    Hemoglobin (CBC)  8.2 (Result(s) reported on 21 May 2012 at 07:06AM.)   Assessment/Plan:  Assessment/Plan:   Assessment 1) heme positive stool in the setting of abnormal ct showing proximal duodenal thickening.  concern for DU in the settign  of h/o nsaid use.    Plan 1) EGD today.  I have discussed the risks benefits and complications of egd to include not limited to bleeding infection perforation and sedation and she wishes to proceed.  further recs to follow.   Electronic Signatures: Barnetta ChapelSkulskie, Zykee Avakian (MD)  (Signed 26-Sep-13 14:00)  Authored: Chief Complaint, VITAL SIGNS/ANCILLARY NOTES, Brief Assessment, Lab Results, Assessment/Plan   Last Updated: 26-Sep-13 14:00 by Barnetta ChapelSkulskie, Denisse Whitenack (MD)

## 2014-12-13 NOTE — Consult Note (Signed)
Chief Complaint:   Subjective/Chief Complaint doing well, still some pain in the right foot.  no n/v or abdiminal pain. no abdominal pain, tolerating clears.   VITAL SIGNS/ANCILLARY NOTES: **Vital Signs.:   27-Sep-13 10:52   Vital Signs Type Recheck   Systolic BP Systolic BP 110   Diastolic BP (mmHg) Diastolic BP (mmHg) 60   Mean BP 76   BP Source  if not from Vital Sign Device manual   Pulse Ox % Pulse Ox % 95   Pulse Ox Activity Level  At rest   Oxygen Delivery 2L    13:35   Vital Signs Type Routine   Temperature Temperature (F) 97.5   Celsius 36.3   Temperature Source Oral   Pulse Pulse 67   Respirations Respirations 16   Systolic BP Systolic BP 97   Diastolic BP (mmHg) Diastolic BP (mmHg) 53   Mean BP 67   BP Source  if not from Vital Sign Device Patient is sleep   Pulse Ox % Pulse Ox % 95   Pulse Ox Activity Level  At rest   Oxygen Delivery 2L   Brief Assessment:   Cardiac Regular    Respiratory clear BS    Gastrointestinal details normal Soft  Nontender  Nondistended  No masses palpable  Bowel sounds normal   Lab Results: General Ref:  26-Sep-13 16:49    Helicobacter pylori AB. IgG, IgA, IgM ========== TEST NAME ==========  ========= RESULTS =========  = REFERENCE RANGE =  HELICOBACTER P. AB PANEL  H pylori, IgM, IgG, IgA Ab H. pylori, IgG Abs              [H  6.4 U/mL             ]           0.0-0.8             Negative            <0.9                                             Indeterminate  0.9 - 1.0                                             Positive            >1.0 H. pylori, IgA Abs              [   Result Pending       ]    H. pylori, IgM Abs              [   <9.0 units           ]           0.0-8.9                                                Negative          <9.0  Equivocal   9.0 - 11.0                      Positive         >11.0  .                This test was developed and its performance                characteristics determined by [facility]. It has not been             cleared or approved by the Food and Drug Administration.                Results of this test are for investigational purposes                only. The result should not be used as a diagnostic                procedure without confirmation of the diagnosis by                another medically diagnostic product or procedure.                                                                      .                **Please note reference interval changeComprehensive Outpatient Surge**               [facility] [place]            No: [id number]           [address], [place], [place] [id number]           Mila HomerWilliam F Hancock, MD         [phone]   Result(s) reported on 22 May 2012 at 12:17PM.   Assessment/Plan:  Assessment/Plan:   Assessment 1) abnormal CT, heme positive stool.  EGD showing GU and multiple DU.  no active bleeding.  H pylori serology positive for IgG, IgA pending.    Plan 1) continue ppi drip for another 24 hours, then change to iv bid.  Will need to continue bid po ppi as outpatient and avoid nsaids. see above, following.   Electronic Signatures: Barnetta ChapelSkulskie, Maye Parkinson (MD)  (Signed 27-Sep-13 17:04)  Authored: Chief Complaint, VITAL SIGNS/ANCILLARY NOTES, Brief Assessment, Lab Results, Assessment/Plan   Last Updated: 27-Sep-13 17:04 by Barnetta ChapelSkulskie, Delos Klich (MD)

## 2014-12-13 NOTE — Consult Note (Signed)
Chief Complaint:   Subjective/Chief Complaint very sleepy, denies abdominal apin or nausea. foot pain a little better at times.   VITAL SIGNS/ANCILLARY NOTES: **Vital Signs.:   29-Sep-13 13:57   Temperature Temperature (F) 98.8   Celsius 37.1   Temperature Source oral   Pulse Pulse 61   Respirations Respirations 18   Systolic BP Systolic BP 107   Diastolic BP (mmHg) Diastolic BP (mmHg) 60   Mean BP 75   Pulse Ox % Pulse Ox % 92   Pulse Ox Activity Level  At rest   Oxygen Delivery Room Air/ 21 %   Brief Assessment:   Cardiac Regular    Respiratory clear BS    Gastrointestinal details normal Soft  Nontender  Nondistended  No masses palpable  Bowel sounds normal   Lab Results:  Routine Chem:  29-Sep-13 04:40    Result Comment LABS - This specimen was collected through an   - indwelling catheter or arterial line.  - A minimum of 5mls of blood was wasted prior    - to collecting the sample.  Interpret  - results with caution.  Result(s) reported on 24 May 2012 at 04:48AM.  Routine Hem:  29-Sep-13 04:40    WBC (CBC) 8.1   RBC (CBC)  2.25   Hemoglobin (CBC)  7.2   Hematocrit (CBC)  21.6   Platelet Count (CBC) 232   MCV 96   MCH 31.8   MCHC 33.1   RDW  14.6   Neutrophil % 77.5   Lymphocyte % 9.5   Monocyte % 8.9   Eosinophil % 3.4   Basophil % 0.7   Neutrophil # 6.3   Lymphocyte #  0.8   Monocyte # 0.7   Eosinophil # 0.3   Basophil # 0.1   Assessment/Plan:  Assessment/Plan:   Assessment 1) abnormal ct scan with duodenitis and heme positive stool.  EGD showing multiple large duodenal ulcers and a significant gastric ulcer. Helicobacter pylori positive, now on treatment. 2) patient continues with significant anemia, consider PRBC tfx.    Plan 1) contineu current from a GI standpoint.  Uncertain as to when it would be safer to restart anticoagulation for vascular lesin.  There was no active bleeding at ulcer sites, but all had significant eschar.  Would consider  restarting after h. pylori tx complete. continue bid ppi even after h. pylori treatment is complete until repeat egd in about 7 weeks.   Electronic Signatures: Barnetta ChapelSkulskie, Jazilyn Siegenthaler (MD)  (Signed 29-Sep-13 15:27)  Authored: Chief Complaint, VITAL SIGNS/ANCILLARY NOTES, Brief Assessment, Lab Results, Assessment/Plan   Last Updated: 29-Sep-13 15:27 by Barnetta ChapelSkulskie, Shizuye Rupert (MD)

## 2014-12-13 NOTE — Consult Note (Signed)
PATIENT NAME:  Jennifer Tucker, Jennifer Tucker MR#:  [id number] DATE OF BIRTH:  03-28-1923  DATE OF CONSULTATION:  05/20/2012  REFERRING PHYSICIAN:  Enedina FinnerSona Patel, MD  CONSULTING PHYSICIAN:  Christena DeemMartin U. Lavalle Skoda, MD  REASON FOR CONSULTATION: Left lower quadrant abdominal pain, acute anemia, duodenal inflammation on CT scan.   HISTORY OF PRESENT ILLNESS: Jennifer Tucker is an 81 year old Caucasian female who was readmitted to the hospital on the same day of discharge from a previous admission. She came back in last night. She has a history of severe atherosclerosis in the lower extremities. On 05/12/2012, she had a right lower extremity angiography with catheter placement and atherectomy done of the right superficial femoral artery. She also underwent a transluminal angioplasty with a stent placement in the right superficial femoral artery and the right peroneal. She had a complication with development of hematoma at the catheter site. She did require blood transfusion. She states that she was having some problems with nausea and vomiting but in talking with her further this has been going on for at least a month prior to even that hospitalization. She went home yesterday and when she got home she was feeling bad, started to have more problems with nausea and vomiting and came back into the hospital. She was found to be Hemoccult positive on admission as well as anemic. She has difficulty taking narcotics, these causing her problems with nausea and vomiting and she stated that she felt that her symptoms were related with that. She has been having some epigastric discomfort as well. She did notice yesterday some mucosy brown-colored stool but no black stools. There has been no coffee-ground emesis, no melena. There is no history of peptic ulcer disease. She did have an EGD and colonoscopy done on 10/06/2006 with finding of diverticulosis, the EGD being negative. She has a history of remote perforated colon secondary to  diverticulitis requiring partial colon resection at that time. She has, however, been taking ibuprofen for at least a year or so in regards to the right leg pain particularly up to two every 4 to 6 hours. On admission to the hospital, she did have a CT scan of the abdomen and pelvis, this showing some thickening in the duodenum concerning for possible ulceration. She has been having problems with heartburn relatively frequently but no dysphagia. She has a bowel movement daily. She does occasionally take a stool softener.   PAST MEDICAL HISTORY:  1. History of complete heart block with pacemaker.  2. History of DVT with pulmonary embolism, on chronic Coumadin anticoagulation.  3. Hypertension.  4. Hypothyroidism.  5. Hyperlipidemia.  6. Diabetes, type II.  7. History of diverticulosis as noted above.  8. History of bowel resection with colostomy and takedown.  9. History of cholecystectomy.  10. Bilateral knee surgery.   GI FAMILY HISTORY: Negative for colorectal cancer, liver disease, or ulcers.   SOCIAL HISTORY: The patient is widowed. She lives with her daughter. She does not use alcohol or tobacco products.   OUTPATIENT MEDICATIONS:  1. Amlodipine 10 mg once a day. 2. Carvedilol 25 mg twice a day.  3. Fenofibrate 200 mg once a day. 4. Glipizide 10 mg 1 tablet twice a day. 5. Imipramine 25 mg at bedtime.   6. Isosorbide mononitrate 30 mg once a day. 7. Levothyroxine 88 mcg daily. 8. Pravastatin 20 mg a day. 9. Warfarin 2.5 mg once a day.   ALLERGIES: She is allergic to morphine, this more so however giving her problems with nausea and  vomiting.   PHYSICAL EXAMINATION:   VITAL SIGNS: Temperature 98.7, pulse 60, respirations 22, blood pressure 123/51, pulse oximetry 94%.   GENERAL: She is an 81 year old Caucasian female in no acute distress, appears fatigued, pale.   HEENT: Normocephalic, atraumatic.   EYES: Anicteric.   NOSE: Septum midline. No lesions.   OROPHARYNX: No  lesions.   NECK: No JVD. No thyromegaly.   HEART: Regular rate and rhythm.   LUNGS: Bilaterally clear.   ABDOMEN: Soft. She is tender to palpation throughout the right lower quadrant with a hematoma noted in this area as stated above. She is mildly tender to palpation in the epigastric region. There is some mild generalized abdominal discomfort as well. Bowel sounds are positive and normoactive. There is no rebound.   EXTREMITIES: No clubbing, cyanosis, or edema.   NEUROLOGICAL: Cranial nerves II through XII grossly intact.   LABORATORY, DIAGNOSTIC, AND RADIOLOGICAL DATA: Glucose 140, BUN 21, creatinine 0.76, sodium 134, potassium 4.5, chloride 104, bicarb 25, osmolality 285, calcium 8.7, lipase 442. Hepatic profile normal with the exception of albumin being low at 2.5. Her hemogram last night showed a white count of 13.1, hemoglobin and hematocrit 8.0/23.6, platelet count 296, MCV 97. She had a repeat hemoglobin drawn this morning at 02:00, this showing 7.6. She had 1 unit of blood transfused this morning and hemoglobin afterwards was 8.1. She has shown no evidence of GI bleeding with the exception of the Hemoccult positive stool. Her INR is 1.3 with a pro-time of 16.8, activated PTT 34.3. Urinalysis showing 30 mg/dL protein, 2+ leukocyte esterase.   She had an ECG showing electronic ventricular pacemaker. No significant change from 03/15/2012.   She had a CT scan of the abdomen and pelvis last night, this done for abdominal pain. This showed that there was some wall thickening in the first and second portion of duodenum with surrounding inflammatory changes concerning for duodenal ulcer. Upper endoscopy was recommended. She also showed a right abdominal oblique muscle hematoma.   ASSESSMENT: Hemoccult-positive stool and anemia in the setting of NSAID use as well as history of Coumadin use. The patient has been off Coumadin for over a week in regards to her recent vascular procedure. She is  currently being transfused. She has been hemodynamically stable. There is no evidence of melena or hematemesis.   RECOMMENDATIONS:  1. Finish transfusions as you are.  2. Continue IV PPI drip as you are. 3. Serial hemoglobins.  4. Will arrange for EGD tomorrow afternoon for further evaluation of duodenal lesion noted on CT. I have discussed the risks, benefits, and complications of EGD to include, but not limited to, bleeding, infection, perforation, the risk of sedation, and she wishes to proceed.  5. Will follow with you.   Thank you for this consult.   ____________________________ Christena Deem, MD mus:drc D: 05/20/2012 16:06:02 ET T: 05/20/2012 16:28:30 ET JOB#: [id number]  cc: Christena Deem, MD, <Dictator> Christena Deem MD ELECTRONICALLY SIGNED 06/25/2012 11:20

## 2014-12-13 NOTE — H&P (Signed)
PATIENT NAME:  Jennifer Tucker, Jennifer Tucker MR#:  [id number] DATE OF BIRTH:  Sep 22, 1922  DATE OF ADMISSION:  05/19/2012  PRIMARY CARE PHYSICIAN: Dr. Orson Aloe  EDD REFERRING PHYSICIAN:   Dr. Sharma Covert   CHIEF COMPLAINT: Abdominal pain.   HISTORY OF PRESENT ILLNESS: Jennifer Tucker is an 81 year old pleasant Caucasian female with history of lower extremity atherosclerosis.  Recently on 09/17 she had a procedure done by Dr. Gilda Crease. She underwent right lower extremity angiography with catheter placement and Crosser atherectomy of her right superficial femoral artery, percutaneous transluminal angioplasty and stent placement in right superficial femoral artery, and also right peroneal. The procedure was complicated by development of hematoma. She required a blood transfusion. The patient states that she left home Thursday and since that time she has been sick, reporting  development of vomiting without hematemesis. She has left lower quadrant abdominal pain that is diffuse in nature extending from the left lower quadrant towards the mid abdomen and also towards the umbilicus. The pain was described as sharp pain, severity 7on a scale of 10. She is unsure if she has fever, stating maybe low-grade fever. She is also complaining that she has inability to control her bowels and she has mucous discharge from the anal area. She has decreased appetite and increased generalized weakness. The patient was brought to the hospital for evaluation and found to have significant anemia. Her hemoglobin dropped to a level of 8. Her recent hemoglobin after blood transfusion was 11 and that was on 09/18. In regard to her right leg atherosclerosis, she states that the pain is still there.   REVIEW OF SYSTEMS: CONSTITUTIONAL: Denies any fever. No chills, although she is unsure if she had a low-grade fever. She has fatigue. EYES: No blurring of vision. No double vision. ENT: No hearing impairment. No sore throat. No dysphagia.  CARDIOVASCULAR: No chest pain. No shortness of breath. No syncope. RESPIRATORY: No cough. No sputum production. No shortness of breath. GASTROINTESTINAL: Reports left lower quadrant abdominal pain, nausea and  she has vomiting. Also she has some problem with her bowels that she is unable to control well with some mucous discharge. GENITOURINARY: No dysuria. No frequency of urination. MUSCULOSKELETAL: No joint pain or swelling. No muscular pain or swelling. INTEGUMENT: No skin rash. No ulcers, but she has hematoma over the right lower abdomen. NEURO: No focal weakness. No seizure activity. No headache. PSYCH: No anxiety. No depression. ENDOCRINE: No polyuria or polydipsia. No heat or cold intolerance.   PAST MEDICAL HISTORY:  1. Recent procedure on 09/17 for atherosclerosis of the right leg. She underwent percutaneous  angioplasty stent implant in right SFA and also right peroneal artery.  She also developed hematoma and drop in her hemoglobin and required a blood transfusion and stabilization of her blood pressure.  2. Complete heart block and pacemaker insertion.  3. History of pulmonary embolism and deep vein thrombosis maintained on chronic anticoagulation with Coumadin.  4. Systemic hypertension.  5. Hyperlipidemia.  6. Hypothyroidism.  7. Diabetes mellitus, type 2. 8. History of diverticulosis and diverticulitis. At one point she had also perforation. This ended with colostomy with revision.   PAST SURGICAL HISTORY:  1. Pacemaker insertion. 2. Kidney stent implant, recently. That is, in  September she had atherectomy and stent implant for peripheral vascular disease as above.  3. History of bowel resection and colostomy.  4. History of cholecystectomy.  5. Bilateral knee surgery.   FAMILY HISTORY: Her mother died from stroke in her early 4s. Her father  died from old age. She has a sister who has diabetes mellitus.   SOCIAL HABITS: Nonsmoker. No history of alcohol or drug abuse.   SOCIAL  HISTORY: She is widowed. Lives at home with her daughter.   ADMISSION MEDICATIONS:  1. Warfarin 2.5 mg a day.  2. Pravastatin 20 mg a day.  3. Levothyroxine 88 mcg once a day.  4. Isosorbide mononitrate 30 mg once a day.  5. Imipramine 25 mg once a day at night.  6. Ibuprofen 200 mg, 2 tablets q. 4-6 h.  7. Glipizide 10 mg twice a day.  8. Fenofibrate 200 mg once a day. 9. Carvedilol 25 mg twice a day.  10. Amlodipine 10 mg a day.   ALLERGIES: Morphine and generally all narcotics. She has intolerance causing nausea and vomiting.   PHYSICAL EXAMINATION:  VITAL SIGNS: Blood pressure 115/66, respiratory rate 18, pulse 83, temperature 98.3, oxygen saturation 96%.   GENERAL APPEARANCE: Elderly female lying in bed in no acute distress.   HEAD: Remarkable for pallor. No icterus. No cyanosis.   ENT: Hearing was normal. Nasal mucosa, lips, tongue were normal.   EYES: Normal iris and conjunctivae. Pupils about 5 mm, equal and reactive to light.   NECK: Supple. Trachea at midline. No thyromegaly. No cervical lymphadenopathy. No masses.   HEART: Normal S1, S2. No S3, S4. There is a grade 1 to 2 systolic murmur at the left sternal border. There are bilateral carotid bruits.   LUNGS: Normal breathing pattern without use of accessory muscles. No rales. No wheezing.   ABDOMEN: Soft. There is mild to moderate tenderness at the left lower quadrant area. No rebound. No rigidity. There is a subcutaneous hematoma at the right lower quadrant area. No hepatosplenomegaly. No masses. No hernias.   SKIN: No ulcers. No subcutaneous nodules. Subcutaneous hematoma is observed at the right lower quadrant abdominal wall.   MUSCULOSKELETAL: No joint swelling. No clubbing.   NEUROLOGIC: Cranial nerves II through XII are intact. No focal motor deficit.   PSYCHIATRIC: The patient is alert and oriented times three. Mood and affect were normal.   LABORATORY, DIAGNOSTIC, AND RADIOLOGICAL DATA: CT scan of the  abdomen showed there is wall thickening of the first and second portions of the duodenum with surrounding inflammatory changes concerning for duodenal ulcer disease. There is also right abdominal oblique muscle hematoma described as severe expansion with measurements 4.9 cm in thickness consistent with muscular hematoma. Serum glucose 240. BUN 21, creatinine 0.7, sodium 137, potassium 4.5. Lipase of 442, total protein 6.5, albumin 2.5, liver transaminases and bilirubin were normal. CBC showed white count of 13,000, hemoglobin 8, hematocrit 23, platelet count 297. Again, her hemoglobin posttransfusion on 09/18  was 11. Prothrombin time 16. INR 1.3. APTT 34. Urinalysis was unremarkable except for +2 leukocyte esterase, however, there were only 3 white blood cells.   ASSESSMENT:  1. Left lower quadrant abdominal pain. Etiology is unclear but no evidence of diverticulitis or any abscess formation.  2. Acute anemia likely secondary to acute blood loss. The etiology of this blood loss is unclear, whether related to the hematoma at the time of the vascular procedure. However, Dr. Gilda CreaseSchnier, the vascular surgeon, feels that this recent drop is not related. Therefore, attention is directed  towards the duodenal area. She might have a bleeding ulcer.  3. Duodenal mucosal thickening suspicious for duodenal ulcer or inflammation.  4. Right abdominal oblique muscle hematoma measuring 4.9 cm in thickness consistent with muscular hematoma.  5. Atherosclerotic disease,  status post recent right leg angioplasty and stent placement in the right SFA and right peroneal artery.  6. Systemic hypertension.  7. Hypothyroidism.  8. Hyperlipidemia.  9. Chronic anticoagulation with Coumadin.  10. History of pulmonary embolism and deep vein thrombosis.  11. History of complete heart block and pacemaker insertion.  12. Diabetes mellitus, type 2. 13. History of diverticulitis with perforation, with colostomy and revision.   PLAN:   We will admit the patient to the Intensive Care given the drop in her hemoglobin and also being anticoagulated with Coumadin. She needs at least 24 hours to ensure stability, then can be transferred if necessary. We will watch her hemoglobin in the next 24 hours every eight hours. Obtain GI consultation to evaluate the duodenal area and also evaluation of left lower quadrant pain especially as she has history of previous diverticulitis with perforation. However, the CT scan does not show much in this area. This may need to be further evaluated with the radiologist to look carefully at the left lower quadrant area. I will discontinue ibuprofen as it may be implicated in her duodenal problem. I will also hold the Coumadin until stabilization of her hemoglobin and anemia.  For the time being there is no urgency to transfuse. We will just watch the hemoglobin. I will keep the patient n.p.o. except for her home medications. IV Protonix. I will consult vascular surgery. Dr. Gilda Crease, since he is familiar with her condition to follow up on the hematoma issue and also the persistence of her right leg pain. Regarding her diabetes I will hold the glipizide since I am keeping her n.p.o. and I will place her only on Accu-Cheks and sliding scale. The glipizide can be resumed after oral feeding is resumed. The patient has a Living Will.  She has appointed her daughter to be the power of attorney. Her daughter's name is  Curator.   TIME SPENT EVALUATING THIS PATIENT: More than one hour due to its complexity and reviewing the medical records.     ____________________________ Carney Corners. Rudene Re, MD amd:bjt D: 05/20/2012 00:08:48 ET T: 05/20/2012 08:06:41 ET JOB#: [id number]  cc: Carney Corners. Rudene Re, MD, <Dictator> Rosalyn Gess. Blocker, MD Carney Corners Jeffery Bachmeier MD ELECTRONICALLY SIGNED 05/21/2012 5:30

## 2014-12-13 NOTE — Discharge Summary (Signed)
PATIENT NAME:  Jennifer Tucker, Jennifer Tucker MR#:  [id number] DATE OF BIRTH:  1922-11-18  DATE OF ADMISSION:  05/19/2012 DATE OF DISCHARGE:  05/25/2012  PRIMARY CARE PHYSICIAN: Orson Aloe, MD  DISCHARGE DIAGNOSES:  1. Acute blood loss anemia.  2. Multiple gastric and duodenal ulcers.  3. Helicobacter pylori.  4. Abdominal hematoma.  5. History of deep vein thrombosis and pulmonary embolism status post inferior vena cava placement.  6. Diabetes mellitus, type II.   CONSULTANTS:  1. Levora Dredge, MD - Vascular Surgery. 2. Barnetta Chapel, MD - Gastroenterology.  IMAGING STUDIES: CT scan of the abdomen and pelvis with contrast showed wall thickening of the first and second portions of duodenum with surrounding inflammatory changes. Right abdominal oblique muscle hematoma.   PROCEDURES:  1. IVC filter placement.  2. EGD showing multiple nonobstructing gastric ulcers and duodenal ulcers with no frank bleeding.   ADMITTING HISTORY AND PHYSICAL: Please see the detailed history and physical dictated on 05/19/2012. In brief, this is an 81 year old Caucasian female patient with history of peripheral arterial disease who recently had right lower extremity angiography with atherectomy and angioplasty with stent placement which was complicated by abdominal hematoma who presented to the Emergency Room feeling sick and reporting vomiting and hematemesis. The patient was admitted to the hospitalist service with acute blood loss anemia and further work-up of GI bleed.   HOSPITAL COURSE:  1. Duodenal gastric ulcers. The patient had an EGD done which showed multiple gastric and duodenal ulcers which are nonobstructing and without any frank bleed. The patient was initially on Protonix IV drip which was later transitioned to IV Protonix twice a day and at the time of discharge is being changed to oral Protonix. The patient is H. pylori positive and has been started on Flagyl and clarithromycin in the hospital;  this will be for 10 days.  2. Acute blood loss anemia. The patient's hemoglobin dropped from 8 to 7.2 and had 1 unit of blood transfused. The patient did receive 2 units of blood in total during the hospital stay and will need follow-up hemoglobin in three days.  3. Peripheral artery disease, status post stent placement in right lower extremity. Dr. Levora Dredge of vascular surgery was consulted as the patient complained of increasing pain in the right lower extremity. No further work-up has been suggested. The patient will be continued on Lyrica and pain medications.  4. History of PE and DVT. The patient had an IVC filter placed as she could not be on Coumadin for any further secondary to the GI bleed. The patient will follow up with vascular surgery for reassessment of need for anticoagulation and continuation/removal of the IVC filter.   On the day of discharge, the patient still complains of right lower extremity pain, does not have any nausea or vomiting. Her vitals show a temperature of 98.5, pulse 67, blood pressure 117/63, saturating 94% on room air, and is being discharged to a skilled nursing facility in a stable condition. The patient did receive 1 unit blood transfusion prior to discharge. Her last hemoglobin was 7.2. This needs to be rechecked in four days.   DISCHARGE MEDICATIONS:  1. Fenofibrate 200 mg oral once a day.  2. Amlodipine 10 mg oral once a day.  3. Coreg 25 mg oral two times a day.  4. Pravastatin 20 mg oral once a day.  5. Pregabalin 75 mg oral once a day.  6. Isosorbide mononitrate 30 mg oral once a day.  7. Imipramine 25 mg  oral once a day.  8. Levothyroxine 88 mcg oral once a day.  9. Metronidazole 500 mg oral twice a day for 10 days.  10. Oxycodone 5 mg oral instant release every six hours as needed for pain.  11. Ultram 50 mg oral three times daily as needed for pain.  12. Clarithromycin 500 mg oral twice a day for 10 days.  13. Protonix 40 mg oral twice a  day.   DISCHARGE INSTRUCTIONS: The patient will be on a diabetic diet with Accu-Cheks before meals and at bedtime. She will continue her medications as prescribed. The patient will be on activity as tolerated with assistance. She is to follow up with Dr. Gilda CreaseSchnier of vascular surgery and Dr. Marva PandaSkulskie of gastroenterology in 1 to 2 weeks. She will need a follow-up hemoglobin checked in 3 to 4 days. She is to call her doctor or return to the Emergency Room if she has any further blood in her vomiting or blood in stool.   TIME SPENT: Time spent today on this discharge dictation along with coordinating care and counseling of the patient and discussing the case with the patient and family was 45 minutes.  Addendum - Patient had worseng cyanosis and pain RLE foot and discharge was held for Angiography. ____________________________ Molinda BailiffSrikar R. Nieshia Larmon, MD srs:slb D: 05/25/2012 12:45:41 ET T: 05/25/2012 12:55:25 ET JOB#: [id number]  cc: Wardell HeathSrikar R. Asahel Risden, MD, <Dictator> Rosalyn GessMichael E. Blocker, MD Renford DillsGregory G. Schnier, MD Christena DeemMartin U. Skulskie, MD Orie FishermanSRIKAR R Solash Tullo MD ELECTRONICALLY SIGNED 05/28/2012 11:21

## 2014-12-13 NOTE — Consult Note (Signed)
    Comments   Spoke with pt's daughter via phone. She understands need for amputation but will respect pt's decisions. She feels that with time and increased pain, pt may make decision to pursue the amputation. She would like for patient to go to rehab prior to returning home. Daughter also recognizes the increased risk of complications and overall decline from this point forward. She says that patient wants to be a DNR. Will change code status to reflect this.   Electronic Signatures: Akosua Constantine, Daryl EasternJoshua R (NP)  (Signed 03-Oct-13 15:34)  Authored: Palliative Care   Last Updated: 03-Oct-13 15:34 by Malachy MoanBorders, Kerri-Anne Haeberle R (NP)

## 2014-12-13 NOTE — Consult Note (Signed)
Brief Consult Note: Diagnosis: heme positive stool, abnormal ct.   Patient was seen by consultant.   Consult note dictated.   Recommend to proceed with surgery or procedure.   Recommend further assessment or treatment.   Comments: Patient seen and examined, full consult to follow.   Patietnreadmitted to the hospital the same day of d/c for previous problem.  S/p pta and stent of right iliac for symptomatic stenosis, on coumadin as o/p due to h/o PTE several years ago.  Patient relates n/v for at least a month, coinciding wht the starting of daily IBP for right leg pain.  Heme positive, and anemia, no overt melena or coffee ground emesis.  Agree with iv ppi drip as you are, tfx and serial hgb, hold anticoagulation.  Planning egd for tomorrow pm.  I have discussed the risks benefits and complications of egd to include not limited to bleeding infection perforation and sedation and she wishes to proceed.  Electronic Signatures: Barnetta ChapelSkulskie, Martin (MD)  (Signed 25-Sep-13 13:36)  Authored: Brief Consult Note   Last Updated: 25-Sep-13 13:36 by Barnetta ChapelSkulskie, Martin (MD)

## 2014-12-13 NOTE — Discharge Summary (Signed)
PATIENT NAME:  Jennifer DingwallCANOWSKI, Loriel M MR#:  [id number] DATE OF BIRTH:  [birth year]  DATE OF ADMISSION:  05/19/2012 DATE OF DISCHARGE:  06/03/2012  DISCHARGE DIAGNOSIS: Acute limb ischemia.   Please also refer to interim discharge summary dictated by Dr. Elpidio AnisSudini on 05/25/2012 along with other interim summary dictated on 05/29/2012 by him for more details. The rest of the diagnoses remain the same as dictated in the previous interim summary by Dr. Elpidio AnisSudini on 05/29/2012 and 05/19/2012.   SECONDARY DIAGNOSES:  1. Hypertension.  2. Hyperlipidemia.  3. Hypothyroidism.  4. Type 2 diabetes.  5. History of diverticulosis.   CONSULTATIONS: As dictated by Dr. Elpidio AnisSudini on interim summary on 05/29/2012 and 05/19/2012. No new consultations were obtained.   PROCEDURES/RADIOLOGY: As dictated by Dr. Elpidio AnisSudini on previous interim summary on 05/19/2012 and 05/29/2012. Chest x-ray was obtained on 06/01/2012 and showed small bilateral pleural effusions similar to prior.   HISTORY AND SHORT HOSPITAL COURSE: Please note I rounded on this patient from 10/07 until 06/03/2012.   The patient is an 81 year old female with above medical problems who was admitted for acute blood loss anemia. She was also found to have left lower quadrant abdominal pain. Please see Dr. Riley Nearingarwish's dictated history and physical for further details. Gastroenterology consultation was obtained with Dr. Marva PandaSkulskie who recommended PPI intravenous drip. Please see Dr. Eddie NorthSudini's dictated previous interim summary on 09/24 and 10/04 for more details. The patient was found to have acute limb ischemia in the right lower extremity and was having a lot of pain for which vascular surgery recommended amputation, but the patient did not want that, neither the family was agreeable to the same. She was requiring 4 to 6 liters of oxygen during my rounding the last three days and her oxygen level was slowly weaning down. She was on about 4 liters of oxygen on the date of  discharge. She was evaluated by physical therapy, but she was unable to contribute much as she was very weak and was having a lot of pain. Palliative care had a family meeting along with discussion with the patient and it was felt that the patient would not benefit from rehab as she was unable to participate in considering her complex medical conditions and acute limb ischemia. The patient was deemed eligible for hospice services and family was in agreement to place her in the hospice home where she was discharged in fair condition.   PERTINENT PHYSICAL EXAMINATION ON THE DATE OF DISCHARGE: VITAL SIGNS: On the date of discharge her vital signs were as follows: Temperature 98.5, heart rate 70 per minute, respirations 16 per minute, blood pressure 138/50. She was saturating 93% on 4 liters oxygen via nasal cannula. LUNGS: Decreased breath sounds at the bases, occasional rales. No wheezing or crackles. CARDIOVASCULAR: S1, S2 normal. 2/6 systolic ejection murmur present. ABDOMEN: Soft, hypoactive bowel sounds, otherwise benign. NEUROLOGIC: Nonfocal examination. PSYCHIATRIC: The patient was intermittently confused. Overall health status seemed to be declining. All other physical examination remained at baseline.   DISCHARGE MEDICATIONS:  1. Roxanol 0.25 to 0.5 mL p.o./sublingual every 1 to 2 as needed.  2. Lorazepam 0.5 to 1 mg p.o./sublingual every 2 to 4 hours as needed.  3. Ranitidine 150 mg p.o. b.i.d.  4. ABHR suppository one per rectum every 4 to 6 hours as needed.  5. Levothyroxine 88 mcg p.o. daily.   DISCHARGE DIET: As tolerated.   DISCHARGE ACTIVITY: As tolerated.   CODE STATUS: DO NOT RESUSCITATE.   DISCHARGE INSTRUCTIONS: 1. Medication  to be crushed or use liquid when appropriate. May change to rectal route if unable to swallow.  2. Oxygen 2 to 4 liters via nasal cannula continuous and as needed.  3. Foley catheter to be left indwelling for urinary incontinence/prevention of skin  breakdown.    FOLLOW-UP: The patient was instructed to follow-up with her primary care physician, Dr. Orson Aloe, in 1 to 2 weeks.   TOTAL TIME DISCHARGING THIS PATIENT: 55 minutes.   ____________________________ Ellamae Sia. Sherryll Burger, MD vss:ap D: 06/07/2012 09:43:04 ET T: 06/07/2012 13:47:59 ET JOB#: [id number]  cc: Landry Kamath S. Sherryll Burger, MD, <Dictator> Rosalyn Gess. Blocker, MD Christena Deem, MD Annice Needy, MD Ned Grace, MD Ellamae Sia [facility] MD ELECTRONICALLY SIGNED 06/07/2012 16:09

## 2014-12-13 NOTE — Op Note (Signed)
PATIENT NAME:  Jennifer Tucker, Jennifer Tucker MR#:  [id number] DATE OF BIRTH:  Oct 12, 1922  DATE OF PROCEDURE:  05/27/2012  PREOPERATIVE DIAGNOSES:  1. Ischemia of right foot.  2. Atherosclerotic occlusive disease of bilateral lower extremities with rest pain of right lower extremity.  3. Complication of vascular device.   POSTOPERATIVE DIAGNOSES:  1. Ischemia of right foot.  2. Atherosclerotic occlusive disease of bilateral lower extremities with rest pain of right lower extremity.  3. Complication of vascular device.   PROCEDURE PERFORMED: Right lower extremity angiography, third order catheter placement.   SURGEON: Levora Dredge, M.D.   SEDATION: Versed 1 mg plus fentanyl 25 mcg administered IV. Continuous ECG, pulse oximetry and cardiopulmonary monitoring is performed throughout the entire procedure by the interventional radiology nurse. Total sedation time was approximately one hour.   ACCESS: 5 French sheath left common femoral artery.   FLUOROSCOPY TIME: 12.9 minutes.   CONTRAST USED: Isovue 60 mL.   INDICATIONS: Jennifer Tucker is an 81 year old woman with worsening ischemic symptoms of her right lower extremity. She recently underwent intervention with recanalization of the SFA.  Her postoperative course was complicated with hematoma and hypotension. She has also had significant ulcer disease flare-up and has multiple active ulcers. The risks and benefits for angiography to ascertain the extent of her current arterial status and the potential for correction was reviewed, all questions answered, and the patient agrees to proceed.   DESCRIPTION OF PROCEDURE: The patient is taken to special procedures and placed in the supine position. After adequate sedation is achieved, both groins are prepped and draped in sterile fashion. 1% lidocaine is infiltrated into the soft tissues overlying the left femoral impulse. Ultrasound is placed in a sterile sleeve. Under direct ultrasound visualization, the  common femoral artery is identified. It is echolucent and pulsatile indicating patency. Image is recorded. Micropuncture needle is then inserted into the anterior wall of the common femoral artery and microwire followed by microsheath, J-wire followed by 5 French sheath. Pigtail catheter is then advanced and LAO projection is obtained of the pelvis. The aortic bifurcation is localized and using a combination of a VS-1 catheter with a straight glide catheter, stiff angled Glidewire, and Rosen wire the aortic bifurcation is crossed and the straight catheter is advanced into the profunda femoris. Hand injection of contrast is then used in an oblique view to demonstrate the femoral anatomy. After review of the images, a full strength 20 mL bolus of contrast is utilized to try to ascertain the tibial anatomy below the knee.   The catheter is then removed, J-wire is advanced, and a StarClose device is deployed without difficulty, after oblique view of the left groin is obtained by hand injection.   INTERPRETATION: The distal aorta and right common and external iliac arteries are widely patent and the previously placed common iliac artery stent is noted. This is negotiated with the [facility] wire. The common femoral and profunda femoris are patent. The superficial femoral artery occludes approximately 1 cm distal to its origin. There is faint reconstitution of the popliteal, at the level of the knee. On distal imaging, there appears to be the proximal half of an anterior tibial, but this appears to occlude distally. The peroneal also is faintly observed, but distally there does not appear to be flow.   SUMMARY: Ischemic foot with nonvisualization of a distal target in a patient who is a very poor candidate for complex interventions and/or surgery given her peptic ulcer disease and the inability to anticoagulate her  or give antiplatelet medications. Amputation will be discussed with the  family. ____________________________ Renford DillsGregory G. Rayona Sardinha, MD ggs:slb D: 05/27/2012 16:09:34 ET     T: 05/27/2012 16:32:39 ET       JOB#: [id number] cc: Renford DillsGregory G. Lash Matulich, MD, <Dictator> Rosalyn GessMichael E. Blocker, MD Renford DillsGREGORY G Essam Lowdermilk MD ELECTRONICALLY SIGNED 06/01/2012 18:15

## 2014-12-13 NOTE — Op Note (Signed)
PATIENT NAME:  Jennifer Tucker, Jennifer M MR#:  [id number] DATE OF BIRTH:  1922-10-12  DATE OF PROCEDURE:  05/21/2012  PREOPERATIVE DIAGNOSES:  1. Deep venous thrombosis with bleeding on anticoagulation requiring cessation of anticoagulation. Gastrointestinal bleed.  2. Anemia from acute blood loss.  3. Right groin hematoma.  4. Peripheral vascular disease.  POSTOPERATIVE DIAGNOSES:  1. Deep venous thrombosis with bleeding on anticoagulation requiring cessation of anticoagulation. Gastrointestinal bleed.  2. Anemia from acute blood loss.  3. Right groin hematoma.  4. Peripheral vascular disease.  PROCEDURES PERFORMED: 1. Ultrasound guidance for vascular access, right jugular vein.  2. Placement of catheter into inferior vena cava.  3. Inferior venacavogram.  4. Placement of a Bard Meridian IVC filter through a right jugular approach.  5. Fluoroscopic guidance for placement of triple lumen catheter.  6. Placement of triple-lumen catheter, right jugular vein.   SURGEON: Annice NeedyJason S. Rosella Crandell, M.D.   ANESTHESIA: Local with moderate conscious sedation.   ESTIMATED BLOOD LOSS: Approximately 25 mL.  FLUOROSCOPY TIME: Approximately 2 minutes.   CONTRAST USED: 15 mL.   INDICATION FOR PROCEDURE: This is an 81 year old female with multiple ongoing issues. She needs a filter due to bleeding on anticoagulation with a history of deep venous thrombosis and now has to cease her anticoagulation due to her bleeding. In addition, she has multiple ongoing issues including acute blood loss anemia and GI bleeding and is going to require endoscopy and has poor venous access. For this reason, a central line will be placed.   DESCRIPTION OF PROCEDURE: The patient was brought to vascular interventional radiology suite and the right neck was sterilely prepped and draped and a sterile surgical field was created. The right jugular vein was visualized with ultrasound and found to be widely patent. It was then accessed under  direct ultrasound guidance without difficulty with a Seldinger needle and a J-wire was placed. After skin nick and dilatation, the sheath for the IVC filter was placed over the wire and placed down through the heart into the inferior vena cava. Venacavogram was performed and this showed a patent vena cava with the level of renal veins at approximately L1. I then deployed the filter at L2. The delivery sheath was removed and we exchanged for an 0.025 wire and then a triple lumen catheter was placed over this wire with the catheter tip parked just into the right atrium and the wire removed. All three lumens withdrew dark red nonpulsatile blood and flushed easily with heparinized saline. It was secured to the skin at approximately 18 cm with four silk sutures, including a U-silk suture around the catheter exit site due to the ooze from the large sheath that had been present. The patient tolerated the procedure well and was taken to the recovery room in stable condition.  ____________________________ Annice NeedyJason S. Herberto Ledwell, MD jsd:slb D: 05/21/2012 08:44:44 ET T: 05/21/2012 10:43:27 ET JOB#: [id number]  cc: Annice NeedyJason S. Yaakov Saindon, MD, <Dictator> Annice NeedyJASON S Stone Spirito MD ELECTRONICALLY SIGNED 05/21/2012 13:44

## 2014-12-13 NOTE — Consult Note (Signed)
Chief Complaint:   Subjective/Chief Complaint continued pain in the right leg.  denies abd pain or nausea.   VITAL SIGNS/ANCILLARY NOTES: **Vital Signs.:   01-Oct-13 13:29   Temperature Temperature (F) 98.8   Celsius 37.1   Temperature Source oral   Pulse Pulse 68   Respirations Respirations 18   Systolic BP Systolic BP 156   Diastolic BP (mmHg) Diastolic BP (mmHg) 55   Mean BP 88   Pulse Ox % Pulse Ox % 91   Pulse Ox Activity Level  At rest   Oxygen Delivery 2L   Brief Assessment:   Cardiac Regular    Respiratory clear BS    Gastrointestinal details normal Soft  Nontender  Nondistended  No masses palpable  Bowel sounds normal   Lab Results: Routine Hem:  01-Oct-13 10:20    WBC (CBC) 10.6   RBC (CBC)  2.79   Hemoglobin (CBC)  8.8   Hematocrit (CBC)  25.8   Platelet Count (CBC) 226   MCV 93   MCH 31.4   MCHC 33.9   RDW  15.7   Neutrophil % 85.5   Lymphocyte % 4.8   Monocyte % 8.8   Eosinophil % 0.8   Basophil % 0.1   Neutrophil #  9.0   Lymphocyte #  0.5   Monocyte # 0.9   Eosinophil # 0.1   Basophil # 0.0 (Result(s) reported on 26 May 2012 at 11:02AM.)   Assessment/Plan:  Assessment/Plan:   Assessment 1) multiple DU, single significant GU.  on ppiiv bid.   2) PVD    Plan 1) case discussed with Dr Gilda CreaseSchnier.  At this point patient will likely do well with a bolus of heparin needed for vascular proceedure. Would continue PPI and add carafate qid.   Electronic Signatures: Barnetta ChapelSkulskie, Anjelika Ausburn (MD)  (Signed 01-Oct-13 16:55)  Authored: Chief Complaint, VITAL SIGNS/ANCILLARY NOTES, Brief Assessment, Lab Results, Assessment/Plan   Last Updated: 01-Oct-13 16:55 by Barnetta ChapelSkulskie, Jesscia Imm (MD)

## 2014-12-13 NOTE — Consult Note (Signed)
Brief Consult Note: Diagnosis: PE; PUD; right flank hematoma; atherosclerosis of the right lower exteremity with rest pain.   Patient was seen by consultant.   Recommend to proceed with surgery or procedure.   Orders entered.   Comments: I would proceed with IVC filter placement tomorrow then EGD willbe done next and pending the findings on the scope treatment of the right leg can be planned.  I believe the current anemia is likely related to the GI process as the right flank hematoma demonstrates findings consistent with a subacute process and the femoral region does not have evidence for recent hemorrhage or pseudonaneurysm.  The patient's foot appears to be stable with cap refill and motor function.  Electronic Signatures: Levora DredgeSchnier, Gregory (MD)  (Signed 25-Sep-13 22:19)  Authored: Brief Consult Note   Last Updated: 25-Sep-13 22:19 by Levora DredgeSchnier, Gregory (MD)

## 2014-12-13 NOTE — Consult Note (Signed)
Comments   Dr Phifer and I met with pt's daughter to discuss medical goals. Daughter agrees that patient is declining and now understands that pt is not likely to be a good rehab candidate. Daughter says that her wish is to focus on comfort only and provide pt with dignity during her final days. She [facility]. Orders written. All questions answered. Will contact hospice home liaison. Will add IV morphine for pain, lorazepam for agitation and use haldol for delerium.  15 minutes dx: Acute limb ischemia; secondary dx: PAD, PVD, gangrene   Electronic Signatures for Addendum Section:  Phifer, Izora Gala (MD) (Signed Addendum 09-Oct-13 15:09)  Billey Chang, NP, and I met with pt's daughter. Agree with assessment and plan as outlined in above note.   Electronic Signatures: Borders, Kirt Boys (NP)  (Signed 09-Oct-13 12:36)  Authored: Palliative Care Phifer, Izora Gala (MD)  (Signed 09-Oct-13 20:52)  Authored: Palliative Care   Last Updated: 09-Oct-13 20:52 by Phifer, Izora Gala (MD)

## 2014-12-13 NOTE — Consult Note (Signed)
Chief Complaint:   Subjective/Chief Complaint denies nausea, vomiting or abdominal pain, one bm this am not bloody, no evidence of gi bleeding, tolerating clears.   VITAL SIGNS/ANCILLARY NOTES: **Vital Signs.:   28-Sep-13 09:43   Vital Signs Type Q 4hr   Temperature Temperature (F) 97.5   Celsius 36.3   Temperature Source Oral   Pulse Pulse 61   Respirations Respirations 18   Systolic BP Systolic BP 110   Diastolic BP (mmHg) Diastolic BP (mmHg) 52   Mean BP 71   Pulse Ox % Pulse Ox % 96   Pulse Ox Activity Level  At rest   Oxygen Delivery 1L  *Intake and Output.:   28-Sep-13 09:41   Stool  stool watery with hard pieces mixed in   Brief Assessment:   Cardiac Irregular    Respiratory clear BS    Gastrointestinal details normal Soft  Nontender  Nondistended  No masses palpable  Bowel sounds normal   Lab Results: General Ref:  26-Sep-13 16:49    Helicobacter pylori AB. IgG, IgA, IgM ========== TEST NAME ==========  ========= RESULTS =========  = REFERENCE RANGE =  HELICOBACTER P. AB PANEL  H pylori, IgM, IgG, IgA Ab H. pylori, IgG Abs              [H  6.4 U/mL             ]           0.0-0.8             Negative            <0.9                                             Indeterminate  0.9 - 1.0                                             Positive            >1.0 H. pylori, IgA Abs              [H  13.5 units           ]           0.0-8.9                                                Negative          <9.0                                                Equivocal   9.0 - 11.0                                                Positive         >11.0                                     .                              **  Please note reference interval change** H. pylori, IgM Abs              [   <9.0 units           ]           0.0-8.9                                                Negative   <9.0                                                Equivocal   9.0 - 11.0                                     Positive         >11.0                                                                      .                This test was developed and its performance                characteristics determined by [facility]. It has not been                cleared or approved by the Food and Drug Administration.                Results of this test are for investigational purposes           only. The result should not be used as a diagnostic                procedure without confirmation of the diagnosis by                another medically diagnostic product or procedure.                .                **Please note [facility]**               [facility]            No: [id number]           [address], [place], [place] [id number]           Mila HomerWilliam F Hancock, MD         [phone]   Result(s) reported on 23 May 2012 at 12:48AM.  Routine Hem:  28-Sep-13 05:40    Hemoglobin (CBC)  7.2 (Result(s) reported on 23 May 2012 at 06:14AM.)   Assessment/Plan:  Assessment/Plan:   Assessment 1) abnormal ct wiht duodenitis.  EGD showing multiple duodenal ulcers, single significant gastric ulcer. helico pylori serology positive. 2) PVD 3) decreased hgb noted today without evidence of gi bleeding/.  Plan 1) continue bid ppi, will need H. Pylori tx with biaxin 500 mg bid for 10 days, bid ppi, metronidazole 500 mg bid for 10 days.  continue ppi bid for 2 weeks after treatment, then continue once daily. 2) will need to repeat egd in about 7-8 weeks to check healing of large GU.   Electronic Signatures: Barnetta Chapel (MD)  (Signed 28-Sep-13 13:14)  Authored: Chief Complaint, VITAL SIGNS/ANCILLARY NOTES, Brief Assessment, Lab Results, Assessment/Plan   Last Updated: 28-Sep-13 13:14 by Barnetta Chapel (MD)

## 2014-12-13 NOTE — Op Note (Signed)
PATIENT NAME:  Jennifer Tucker, Jennifer Tucker MR#:  [id number] DATE OF BIRTH:  02-07-1923  DATE OF PROCEDURE:  05/12/2012  PREOPERATIVE DIAGNOSIS: Atherosclerotic occlusive disease bilateral lower extremities with ulceration of the right lower extremity.   POSTOPERATIVE DIAGNOSIS: Atherosclerotic occlusive disease bilateral lower extremities with rest pain of the right lower extremity.   PROCEDURES PERFORMED:  1. Right lower extremity angiography, third order catheter placement.  2. Crosser atherectomy, right superficial femoral artery.  3. Percutaneous transluminal angioplasty and stent placement, right SFA.  4. Percutaneous transluminal angioplasty, right peroneal.   SURGEON: Renford DillsGregory G Treyce Spillers, Tucker.D.   SEDATION: Versed 5 mg plus fentanyl 200 mcg administered IV. Continuous ECG, pulse oximetry, and cardiopulmonary monitoring was performed throughout the entire procedure by the interventional radiology nurse. Total sedation time was 2 hours, 45 minutes.   ACCESS: 7 French sheath, right common femoral artery, antegrade puncture.   CONTRAST USED: Isovue 125 mL.   FLUORO TIME: 34 minutes.   INDICATIONS: Jennifer Tucker is an 81 year old woman who presented to the office with ulceration of the right leg and foot. She was found to have profound atherosclerotic occlusive disease. Attempt at angiography from left groin approach coursing up and over the aorta was unsuccessful and she is now returned to attempt antegrade puncture and direct right leg access. Risks and benefits were reviewed. All questions answered. The patient agrees to proceed.   DESCRIPTION OF PROCEDURE: The patient is taken to Special Procedures and placed in the supine position. After adequate sedation is achieved, the right groin is prepped and draped in a sterile fashion. Ultrasound is placed in a sterile sleeve. Ultrasound is utilized secondary to lack of appropriate landmarks and to avoid vascular injury. Under direct ultrasound  visualization, the common femoral artery is identified. It is echolucent and pulsatile, indicating patency. Image is recorded for the permanent record. Under direct ultrasound visualization, a micropuncture needle is inserted into the anterior wall of the common femoral artery in an antegrade direction. Microwire is then advanced followed by micro sheath, J-wire followed by 5 French sheath.   5 French sheath is then utilized to create images of the right groin. An LAO projection opens up the femoral bifurcation, and using a combination of  KMP catheter, the sheath, a Glidewire, and then a Magic torque wire the cul-de-sac of the SFA is engaged. The sheath is then advanced along with the wire and positioned in the proximal SFA.   14-s Crosser atherectomy catheter is then prepped on the field. A straight micro sheath is also utilized and using the Crosser and the micro sheath, the SFA is negotiated down to the area of reconstitution. The Crosser atherectomy is then withdrawn. Images are obtained and the catheter has now tracked down to a level alongside the reentry; however, it did not re-enter the true lumen. Several maneuvers, a variety of different wires, a 3-mm balloon dilatation followed by 4-mm balloon dilatation of this area using an angled guiding catheter with the Crosser, and ultimately a KMP catheter and Glidewire are utilized to successfully negotiate the flap and cross into the true lumen. The catheter is then advanced down below the knee where distal runoff is obtained. Images from the previous run distally were inadequate. This does demonstrate subtotal occlusion at the level of the tibioperoneal trunk with hemodynamically significant disease extending down into the peroneal. There does appear to be predominantly single-vessel runoff to the foot via the peroneal.   Wire is then introduced and first the SFA is treated. A 4  x 20 Dorado balloon and subsequently a 5 x 20 Dorado balloon is utilized.  Following the second inflation to 14 atmospheres there is an area in the region of Hunter's canal which remains hemodynamically significant. However, prior to stenting it the distal disease is addressed. The knee is flexed and lateral images are obtained demonstrating diffuse disease within the popliteal and the aforementioned critical stenosis within the tibioperoneal trunk and peroneal. Glidewire and catheter are negotiated into the peroneal and the Glidewire is exchanged for a Magic torque. A 3 x 10 balloon is then inflated to 12 atmospheres for one minute. Follow-up angiography demonstrates the tibioperoneal trunk and peroneal arteries now are well treated. The popliteal is treated with the 4 x 4 cm balloon inflated along its course and follow-up angiography demonstrates an adequate result here as well.   The SFA is then imaged in the AP projection and first a 6 x 10 and subsequently a 6  x 8 stent is placed. Post dilatation is performed with a 5 x 20 Dorado, and subsequently a 6 x 4 balloon is used to dilate the two areas that are still highly stenotic. Following a 6-mm dilatation there is significant improvement. Distal runoff is preserved.   Oblique view of the groin is obtained, and a Mynx device is deployed without difficulty. There are no immediate complications; however, a small hematoma is noted. The patient is taken to recovery in stable condition.   INTERPRETATION: Initial views show occlusion of the SFA throughout. Following angioplasty the SFA is recanalized. There are two areas of high-grade stenosis that require stenting. Peroneal and tibioperoneal trunk are treated with a 3-mm balloon inflation.     ____________________________ Renford Dills, MD ggs:bjt D: 05/12/2012 11:02:39 ET T: 05/12/2012 13:18:21 ET JOB#: [id number]  cc: Renford Dills, MD, <Dictator> Renford Dills MD ELECTRONICALLY SIGNED 05/26/2012 14:05

## 2014-12-13 NOTE — Consult Note (Signed)
Chief Complaint:   Subjective/Chief Complaint Patient seen and examined, please see full GI consult [phone]#[id number]. Patietn admitted to the hospital with n.v abdominal pain.  found with anemia and hemocult positive stool.  Hemodynamically stable, no gross bleeding (melena or hematemesis, abnormal CT with proximal duodenal thickening in the setting of chronic nsaid use for a year.  Continue iv ppi and erial hgb transfuse as needed.  Planning egd for tomorrow pm.  I have discussed the risks benefits and complications of egd to include not limited to bleeding infection perforation and sedation and she wishes to proceed.  Following.   Electronic Signatures: Barnetta ChapelSkulskie, Alastair Hennes (MD)  (Signed 25-Sep-13 16:10)  Authored: Chief Complaint   Last Updated: 25-Sep-13 16:10 by Barnetta ChapelSkulskie, Merle Whitehorn (MD)
